# Patient Record
Sex: Female | Born: 1955 | Hispanic: No | Marital: Married | Smoking: Never smoker
Health system: Southern US, Community
[De-identification: ages and names within clinical notes are randomized; demographics above are authoritative.]

## PROBLEM LIST (undated history)

## (undated) DIAGNOSIS — F32A Depression, unspecified: Secondary | ICD-10-CM

## (undated) DIAGNOSIS — F419 Anxiety disorder, unspecified: Secondary | ICD-10-CM

## (undated) DIAGNOSIS — F329 Major depressive disorder, single episode, unspecified: Secondary | ICD-10-CM

## (undated) DIAGNOSIS — L409 Psoriasis, unspecified: Secondary | ICD-10-CM

---
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
## 1998-02-24 ENCOUNTER — Encounter: Admission: RE | Admit: 1998-02-24 | Discharge: 1998-02-24 | Payer: Self-pay | Admitting: Sports Medicine

## 1998-04-10 ENCOUNTER — Encounter: Admission: RE | Admit: 1998-04-10 | Discharge: 1998-04-10 | Payer: Self-pay | Admitting: Family Medicine

## 1998-08-14 ENCOUNTER — Encounter: Admission: RE | Admit: 1998-08-14 | Discharge: 1998-08-14 | Payer: Self-pay | Admitting: Sports Medicine

## 1998-10-30 ENCOUNTER — Encounter: Admission: RE | Admit: 1998-10-30 | Discharge: 1998-10-30 | Payer: Self-pay | Admitting: Sports Medicine

## 1999-02-19 ENCOUNTER — Other Ambulatory Visit: Admission: RE | Admit: 1999-02-19 | Discharge: 1999-02-19 | Payer: Self-pay | Admitting: Obstetrics and Gynecology

## 1999-03-06 ENCOUNTER — Ambulatory Visit (HOSPITAL_COMMUNITY): Admission: RE | Admit: 1999-03-06 | Discharge: 1999-03-06 | Payer: Self-pay | Admitting: Obstetrics and Gynecology

## 1999-03-06 ENCOUNTER — Encounter: Payer: Self-pay | Admitting: Obstetrics and Gynecology

## 2000-04-28 ENCOUNTER — Encounter: Admission: RE | Admit: 2000-04-28 | Discharge: 2000-04-28 | Payer: Self-pay | Admitting: Family Medicine

## 2000-05-05 ENCOUNTER — Other Ambulatory Visit: Admission: RE | Admit: 2000-05-05 | Discharge: 2000-05-05 | Payer: Self-pay | Admitting: *Deleted

## 2000-05-19 ENCOUNTER — Encounter: Payer: Self-pay | Admitting: *Deleted

## 2000-05-19 ENCOUNTER — Ambulatory Visit (HOSPITAL_COMMUNITY): Admission: RE | Admit: 2000-05-19 | Discharge: 2000-05-19 | Payer: Self-pay | Admitting: *Deleted

## 2001-05-19 ENCOUNTER — Encounter: Admission: RE | Admit: 2001-05-19 | Discharge: 2001-05-19 | Payer: Self-pay | Admitting: Sports Medicine

## 2001-05-20 ENCOUNTER — Ambulatory Visit (HOSPITAL_COMMUNITY): Admission: RE | Admit: 2001-05-20 | Discharge: 2001-05-20 | Payer: Self-pay | Admitting: Obstetrics and Gynecology

## 2001-05-20 ENCOUNTER — Encounter: Payer: Self-pay | Admitting: Obstetrics and Gynecology

## 2001-07-21 ENCOUNTER — Other Ambulatory Visit: Admission: RE | Admit: 2001-07-21 | Discharge: 2001-07-21 | Payer: Self-pay | Admitting: Obstetrics and Gynecology

## 2002-08-10 ENCOUNTER — Other Ambulatory Visit: Admission: RE | Admit: 2002-08-10 | Discharge: 2002-08-10 | Payer: Self-pay | Admitting: *Deleted

## 2002-08-31 ENCOUNTER — Encounter: Payer: Self-pay | Admitting: Obstetrics and Gynecology

## 2002-08-31 ENCOUNTER — Ambulatory Visit (HOSPITAL_COMMUNITY): Admission: RE | Admit: 2002-08-31 | Discharge: 2002-08-31 | Payer: Self-pay | Admitting: Obstetrics and Gynecology

## 2003-03-30 ENCOUNTER — Other Ambulatory Visit: Admission: RE | Admit: 2003-03-30 | Discharge: 2003-03-30 | Payer: Self-pay | Admitting: Obstetrics and Gynecology

## 2003-09-08 ENCOUNTER — Ambulatory Visit (HOSPITAL_COMMUNITY): Admission: RE | Admit: 2003-09-08 | Discharge: 2003-09-08 | Payer: Self-pay | Admitting: Obstetrics and Gynecology

## 2004-05-02 ENCOUNTER — Other Ambulatory Visit: Admission: RE | Admit: 2004-05-02 | Discharge: 2004-05-02 | Payer: Self-pay | Admitting: Obstetrics and Gynecology

## 2007-02-04 ENCOUNTER — Ambulatory Visit (HOSPITAL_COMMUNITY): Admission: RE | Admit: 2007-02-04 | Discharge: 2007-02-04 | Payer: Self-pay | Admitting: Obstetrics and Gynecology

## 2008-03-08 ENCOUNTER — Ambulatory Visit (HOSPITAL_COMMUNITY): Admission: RE | Admit: 2008-03-08 | Discharge: 2008-03-08 | Payer: Self-pay | Admitting: Obstetrics and Gynecology

## 2008-04-26 ENCOUNTER — Ambulatory Visit: Payer: Self-pay | Admitting: Sports Medicine

## 2008-04-26 DIAGNOSIS — Q667 Congenital pes cavus, unspecified foot: Secondary | ICD-10-CM

## 2008-04-26 DIAGNOSIS — M79609 Pain in unspecified limb: Secondary | ICD-10-CM

## 2008-04-26 DIAGNOSIS — E669 Obesity, unspecified: Secondary | ICD-10-CM | POA: Insufficient documentation

## 2008-04-26 DIAGNOSIS — L408 Other psoriasis: Secondary | ICD-10-CM | POA: Insufficient documentation

## 2010-03-22 ENCOUNTER — Ambulatory Visit (HOSPITAL_COMMUNITY): Admission: RE | Admit: 2010-03-22 | Discharge: 2010-03-22 | Payer: Self-pay | Admitting: Obstetrics and Gynecology

## 2010-07-29 ENCOUNTER — Encounter: Payer: Self-pay | Admitting: Obstetrics and Gynecology

## 2011-02-20 ENCOUNTER — Other Ambulatory Visit (HOSPITAL_COMMUNITY): Payer: Self-pay | Admitting: Obstetrics and Gynecology

## 2011-02-20 DIAGNOSIS — Z1231 Encounter for screening mammogram for malignant neoplasm of breast: Secondary | ICD-10-CM

## 2011-03-26 ENCOUNTER — Ambulatory Visit (HOSPITAL_COMMUNITY): Payer: Self-pay

## 2011-03-26 DIAGNOSIS — J309 Allergic rhinitis, unspecified: Secondary | ICD-10-CM | POA: Insufficient documentation

## 2011-03-28 ENCOUNTER — Ambulatory Visit (HOSPITAL_COMMUNITY): Payer: Self-pay

## 2011-04-16 ENCOUNTER — Ambulatory Visit (HOSPITAL_COMMUNITY): Payer: Self-pay

## 2011-05-07 ENCOUNTER — Ambulatory Visit (HOSPITAL_COMMUNITY): Payer: Self-pay

## 2011-05-22 ENCOUNTER — Ambulatory Visit (HOSPITAL_COMMUNITY): Payer: Self-pay

## 2011-10-17 ENCOUNTER — Telehealth: Payer: Self-pay | Admitting: Obstetrics and Gynecology

## 2011-10-17 NOTE — Telephone Encounter (Signed)
TC TO PT REGARDING MESSAGE. PT WOULD LIKE A DISCOUNT CARD FOR SILENOR 3MG  . TOLD PT THAT I WILL LEAVE CARD AT THE  FRONT DESK  FOR PT TO PICK UP AND IF WE HAVE A SAMPLE WILL LEAVE THAT AS WELL. PT VOICED UNDERSTANDING.

## 2012-01-20 ENCOUNTER — Other Ambulatory Visit: Payer: Self-pay | Admitting: Obstetrics and Gynecology

## 2012-05-20 ENCOUNTER — Other Ambulatory Visit: Payer: Self-pay | Admitting: Obstetrics and Gynecology

## 2012-05-21 ENCOUNTER — Other Ambulatory Visit: Payer: Self-pay | Admitting: Obstetrics and Gynecology

## 2012-05-21 MED ORDER — DOXEPIN HCL 6 MG PO TABS: 1.0000 | ORAL_TABLET | Freq: Every evening | ORAL | Status: DC | PRN

## 2012-05-21 NOTE — Telephone Encounter (Signed)
EP to address

## 2013-07-08 HISTORY — PX: FRACTURE SURGERY: SHX138

## 2014-04-01 DIAGNOSIS — S82853A Displaced trimalleolar fracture of unspecified lower leg, initial encounter for closed fracture: Secondary | ICD-10-CM | POA: Insufficient documentation

## 2015-03-01 ENCOUNTER — Encounter: Payer: Self-pay | Admitting: Sports Medicine

## 2015-04-04 DIAGNOSIS — L97529 Non-pressure chronic ulcer of other part of left foot with unspecified severity: Secondary | ICD-10-CM

## 2015-04-04 DIAGNOSIS — L97519 Non-pressure chronic ulcer of other part of right foot with unspecified severity: Secondary | ICD-10-CM | POA: Insufficient documentation

## 2016-12-17 ENCOUNTER — Encounter: Payer: Self-pay | Admitting: Family Medicine

## 2016-12-17 ENCOUNTER — Ambulatory Visit (INDEPENDENT_AMBULATORY_CARE_PROVIDER_SITE_OTHER): Payer: BLUE CROSS/BLUE SHIELD | Admitting: Family Medicine

## 2016-12-17 DIAGNOSIS — M79671 Pain in right foot: Secondary | ICD-10-CM

## 2016-12-17 DIAGNOSIS — M79672 Pain in left foot: Secondary | ICD-10-CM

## 2016-12-18 NOTE — Progress Notes (Signed)
PCP: Deloris Pingyter-Brown, Sherry M, MD  Subjective:   HPI: Patient is a 61 y.o. female here for orthotics.  Patient was last seen in [place] office about 8-9 years ago for custom orthotics. Has a couple pair but both broken down, cracked in plastic portion posteriorly. She is planning to do more racing, activities with her daughter-in-law. Orthotics have made a difference in past with IT band syndrome, pain in arches and knees. 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 Doing some stretches at home. Pain level 0/10 currently though.  No past medical history on file.  Current Outpatient Prescriptions on File Prior to Visit  Medication Sig Dispense Refill  . Doxepin HCl (SILENOR) 6 MG TABS Take 1 tablet (6 mg total) by mouth at bedtime as needed. 30 tablet 5   No current facility-administered medications on file prior to visit.     No past surgical history on file.  Allergies not on file  Social History   Social History  . Marital status: Married    Spouse name: N/A  . Number of children: N/A  . Years of education: N/A   Occupational History  . Not on file.   Social History Main Topics  . Smoking status: Never Smoker  . Smokeless tobacco: Never Used  . Alcohol use Not on file  . Drug use: Unknown  . Sexual activity: Not on file   Other Topics Concern  . Not on file   Social History Narrative  . No narrative on file    No family history on file.  BP 134/81   Pulse 76   Ht 5\' 5"  (1.651 m)   Wt 200 lb (90.7 kg)   BMI 33.28 kg/m   Review of Systems: See HPI above.     Objective:  Physical Exam:  Gen: NAD, comfortable in exam room  Bilateral feet/ankles: No leg length discrepancy. Psoriatic plaques noted lower legs, feet with dryness and scaling. Cavus arches. FROM ankles, 1st MTP joints. No hallux rigidus or valgus. No callus over MT heads or transverse arch collapse.   Assessment & Plan:  1. Bilateral foot and leg pain - done well in past with custom  orthotics and current ones are broken down.  Two new pairs made today.  Felt comfortable - advised to call if any problems.  Given hip abduction exercise and stretches for ITBS.  Patient was fitted for a : standard, cushioned, semi-rigid orthotic. The orthotic was heated and afterward the patient stood on the orthotic blank positioned on the orthotic stand. The patient was positioned in subtalar neutral position and 10 degrees of ankle dorsiflexion in a weight bearing stance. After completion of molding, a stable base was applied to the orthotic blank. The blank was ground to a stable position for weight bearing. Size: 9 blue swirl Base: blue med density eva Posting: none Additional orthotic padding: none Total prep time 50 minutes

## 2016-12-18 NOTE — Assessment & Plan Note (Signed)
done well in past with custom orthotics and current ones are broken down.  Two new pairs made today.  Felt comfortable - advised to call if any problems.  Given hip abduction exercise and stretches for ITBS.  Patient was fitted for a : standard, cushioned, semi-rigid orthotic. The orthotic was heated and afterward the patient stood on the orthotic blank positioned on the orthotic stand. The patient was positioned in subtalar neutral position and 10 degrees of ankle dorsiflexion in a weight bearing stance. After completion of molding, a stable base was applied to the orthotic blank. The blank was ground to a stable position for weight bearing. Size: 9 blue swirl Base: blue med density eva Posting: none Additional orthotic padding: none Total prep time 50 minutes

## 2019-04-14 ENCOUNTER — Other Ambulatory Visit: Payer: Self-pay

## 2019-04-14 ENCOUNTER — Ambulatory Visit (INDEPENDENT_AMBULATORY_CARE_PROVIDER_SITE_OTHER): Payer: BC Managed Care – PPO | Admitting: Orthopaedic Surgery

## 2019-04-14 DIAGNOSIS — M25552 Pain in left hip: Secondary | ICD-10-CM

## 2019-04-14 DIAGNOSIS — M1612 Unilateral primary osteoarthritis, left hip: Secondary | ICD-10-CM

## 2019-04-14 MED ORDER — CELECOXIB 200 MG PO CAPS: 200.0000 mg | ORAL_CAPSULE | Freq: Two times a day (BID) | ORAL | 1 refills | Status: DC | PRN

## 2019-04-14 NOTE — Progress Notes (Signed)
Office Visit Note   Patient: Stacey Patrick           Date of Birth: August 23, 1955           MRN: [id number] Visit Date: 04/14/2019              Requested by: Wayland Salinas, MD [address] [place],  [place] [address] PCP: Wayland Salinas, MD   Assessment & Plan: Visit Diagnoses:  1. Pain in left hip   2. Unilateral primary osteoarthritis, left hip     Plan: I did talk to her about her left hip arthritis.  We do feel is appropriate to obtain an MRI of her left hip to better assess the cartilage and to get a better idea of the extent of her arthritis although on plain film it does look significant to me.  She agrees with this treatment plan.  She is can continue strengthening her hip and work on trying activity modification.  I will send in some Celebrex to see if that helps her differently.  We will see her back after the MRI of her left hip.  Again I feel this is medically necessary at this point given the failure of all forms of conservative treatment.  All question concerns were answered and addressed.  Follow-Up Instructions: Return in about 2 weeks (around 04/28/2019).   Orders:  No orders of the defined types were placed in this encounter.  No orders of the defined types were placed in this encounter.     Procedures: No procedures performed   Clinical Data: No additional findings.   Subjective: Chief Complaint  Patient presents with  . Left Hip - Pain  The patient is a very pleasant and active 63 year old female who is been having severe left hip pain is been worsening for 8 to 9 months now.  She has been to physical therapy for her left hip.  She has had an intra-articular steroid injection in her left hip as well as a piriformis injection.  It does hurt in the groin.  It is detrimentally affecting her actives daily living, her quality of life, and her mobility.  She started to walk different as a result of her left hip pain.  She does have  x-rays that accompany her today.  Things do seem to be worsening for her.  She is having problems putting her socks and shoes on on the left side.  She has no issues with the right side.  She denies any weakness in that leg and denies any numbness and tingling going down her foot.  She is never injured herself before as she is aware of.  There is not a family history of arthritis that she is aware of.  Again she is tried and failed all forms conservative treatment for over 6 months now and that includes anti-inflammatories, physical therapy, multiple injections, rest and activity modification.  HPI  Review of Systems She currently denies any headache, chest pain, shortness of breath, fever, chills, nausea, vomiting  Objective: Vital Signs: There were no vitals taken for this visit.  Physical Exam She is alert and orient x3 and in no acute distress Ortho Exam Examination of her left hip shows pain in the groin with stiffness on internal and external rotation.  Her right hip moves smoothly and normally.  Her bilateral knee exam is normal. Specialty Comments:  No specialty comments available.  Imaging: No results found. X-rays that accompany her are reviewed with  her as well and shows significant arthritis of the left hip.  There is joint space narrowing and flattening of the anterior lateral femoral head.  There peritalar osteophytes.  The right hip appears normal but the left hip definitely has significant narrowing.  PMFS History: Patient Active Problem List   Diagnosis Date Noted  . Unilateral primary osteoarthritis, left hip 04/14/2019  . Skin ulcers of foot, bilateral ([facility]) 04/04/2015  . Trimalleolar fracture 04/01/2014  . Allergic rhinitis 03/26/2011  . OBESITY, UNSPECIFIED 04/26/2008  . PSORIASIS 04/26/2008  . FOOT PAIN, BILATERAL 04/26/2008  . TALIPES CAVUS 04/26/2008   No past medical history on file.  No family history on file.   Social History   Occupational History  .  Not on file  Tobacco Use  . Smoking status: Never Smoker  . Smokeless tobacco: Never Used  Substance and Sexual Activity  . Alcohol use: Not on file  . Drug use: Not on file  . Sexual activity: Not on file

## 2019-04-29 ENCOUNTER — Telehealth: Payer: Self-pay | Admitting: Orthopaedic Surgery

## 2019-04-29 NOTE — Telephone Encounter (Signed)
Please advise 

## 2019-04-29 NOTE — Telephone Encounter (Signed)
The MRI will show everything from the front around to the back and her gluteal area.

## 2019-04-29 NOTE — Telephone Encounter (Signed)
Patient called. Would like to know if her MRI will be on her buttocks as well as hip. Her call back number is [phone]

## 2019-04-30 ENCOUNTER — Ambulatory Visit (HOSPITAL_COMMUNITY)
Admission: RE | Admit: 2019-04-30 | Discharge: 2019-04-30 | Disposition: A | Discharge status: Home / Self Care | Payer: BC Managed Care – PPO | Source: Ambulatory Visit | Attending: Orthopaedic Surgery | Admitting: Orthopaedic Surgery

## 2019-04-30 ENCOUNTER — Other Ambulatory Visit: Payer: Self-pay

## 2019-04-30 DIAGNOSIS — M25552 Pain in left hip: Secondary | ICD-10-CM | POA: Insufficient documentation

## 2019-05-05 ENCOUNTER — Ambulatory Visit: Payer: BC Managed Care – PPO | Admitting: Orthopaedic Surgery

## 2019-05-05 NOTE — Telephone Encounter (Signed)
Patient aware and her appt was reschedule to next week

## 2019-05-13 ENCOUNTER — Ambulatory Visit: Payer: BC Managed Care – PPO | Admitting: Orthopaedic Surgery

## 2019-05-19 ENCOUNTER — Encounter: Payer: Self-pay | Admitting: Physician Assistant

## 2019-05-19 ENCOUNTER — Ambulatory Visit (INDEPENDENT_AMBULATORY_CARE_PROVIDER_SITE_OTHER): Payer: BC Managed Care – PPO | Admitting: Physician Assistant

## 2019-05-19 ENCOUNTER — Other Ambulatory Visit: Payer: Self-pay

## 2019-05-19 DIAGNOSIS — M1612 Unilateral primary osteoarthritis, left hip: Secondary | ICD-10-CM

## 2019-05-19 NOTE — Progress Notes (Signed)
Office Visit Note   Patient: Stacey Patrick           Date of Birth: April 12, 1956           MRN: [id number] Visit Date: 05/19/2019              Requested by: Wayland Salinas, MD [address] [place],  [place] [address] PCP: Wayland Salinas, MD   Assessment & Plan: Visit Diagnoses:  1. Osteoarthritis of left hip, unspecified osteoarthritis type     Plan: Due to the fact the patient has severe pain in his right quality-of-life despite conservative treatment which is including limiting activities, NSAIDs and intra-articular injection.  Along with the  MRI showing severe arthritis along with AVN versus an insufficiency fracture of the femoral head with slight femoral head collapse recommend left total hip arthroplasty.  Risk benefits of surgery discussed with patient.  Postoperative protocol discussed with patient.  Risk include but are not limited to nerve vessel injury, wound healing problems, blood loss, infection and DVT/PE,, reviewed with the patient.  Questions encouraged and answered at length by Dr. Ninfa Linden and myself.  Handout on anterior hip surgery was given to the patient.  She will follow-up with [place] 2 weeks postop.   Follow-Up Instructions: Return 2 weeks post op.   Orders:  No orders of the defined types were placed in this encounter.  No orders of the defined types were placed in this encounter.     Procedures: No procedures performed   Clinical Data: No additional findings.   Subjective: Chief Complaint  Patient presents with  . Left Hip - Follow-up    HPI Mrs. Zavadil returns today to go over the MRI of the left hip.  She continues to have severe pain in the left hip which limits her daily activities.  She has difficulty even walking her dog due to the pain in the hip.  She states it limits her physical activity such as hiking, walking and biking for exercise. MRI dated 05/01/2019 left hip is reviewed with the patient.  Actual  images shown to the patient.  Images show severe osteoarthritis left hip.  Also severe marrow edema is seen within the femoral head superiorly along with mild articular surface collapse concerning for AVN versus subchondral fracture. Review of Systems Negative for fevers chills shortness of breath chest pain  Objective: Vital Signs: There were no vitals taken for this visit.  Physical Exam Constitutional:      Appearance: She is not ill-appearing or diaphoretic.  Pulmonary:     Effort: Pulmonary effort is normal.  Neurological:     Mental Status: She is alert and oriented to person, place, and time.  Psychiatric:        Mood and Affect: Mood normal.        Behavior: Behavior normal.     Ortho Exam  Specialty Comments:  No specialty comments available.  Imaging: No results found.   PMFS History: Patient Active Problem List   Diagnosis Date Noted  . Unilateral primary osteoarthritis, left hip 04/14/2019  . Skin ulcers of foot, bilateral ([place]) 04/04/2015  . Trimalleolar fracture 04/01/2014  . Allergic rhinitis 03/26/2011  . OBESITY, UNSPECIFIED 04/26/2008  . PSORIASIS 04/26/2008  . FOOT PAIN, BILATERAL 04/26/2008  . TALIPES CAVUS 04/26/2008   History reviewed. No pertinent past medical history.  History reviewed. No pertinent family history.  History reviewed. No pertinent surgical history. Social History   Occupational History  . Not  on file  Tobacco Use  . Smoking status: Never Smoker  . Smokeless tobacco: Never Used  Substance and Sexual Activity  . Alcohol use: Not on file  . Drug use: Not on file  . Sexual activity: Not on file

## 2019-06-08 ENCOUNTER — Telehealth: Payer: Self-pay

## 2019-06-08 ENCOUNTER — Other Ambulatory Visit: Payer: Self-pay | Admitting: Orthopaedic Surgery

## 2019-06-08 MED ORDER — CELECOXIB 200 MG PO CAPS: 200.0000 mg | ORAL_CAPSULE | Freq: Two times a day (BID) | ORAL | 1 refills | Status: DC | PRN

## 2019-06-08 NOTE — Telephone Encounter (Signed)
I'll send some in. 

## 2019-06-08 NOTE — Telephone Encounter (Signed)
Refill request for Celebrex Ok to refill? [facility]; [address] [employer]

## 2019-06-17 ENCOUNTER — Other Ambulatory Visit: Payer: Self-pay | Admitting: Physician Assistant

## 2019-06-21 NOTE — Patient Instructions (Addendum)
DUE TO COVID-19 ONLY ONE VISITOR IS ALLOWED TO COME WITH YOU AND STAY IN THE WAITING ROOM ONLY DURING PRE OP AND PROCEDURE DAY OF SURGERY. THE 1 VISITOR MAY VISIT WITH YOU AFTER SURGERY IN YOUR PRIVATE ROOM DURING VISITING HOURS ONLY!  YOU NEED TO HAVE A COVID 19 TEST ON_12/15______ @_12 :25______, THIS TEST MUST BE DONE BEFORE SURGERY, COME  [place] , [id number].  ([place]) ONCE YOUR COVID TEST IS COMPLETED, PLEASE BEGIN THE QUARANTINE INSTRUCTIONS AS OUTLINED IN YOUR HANDOUT.                Stacey Patrick    Your procedure is scheduled on: 06/25/19   Report to [facility] Main  Entrance   Report to Short Stay 5:30 AM     Call this number if you have problems the morning of surgery [place], NO CHEWING GUM CANDY OR MINTS.  Do not eat food After Midnight.   YOU MAY HAVE CLEAR LIQUIDS FROM MIDNIGHT UNTIL 4:30AM  . At 4:30AM Please finish the prescribed Pre-Surgery drink.   Nothing by mouth after you finish the drink !    Take these medicines the morning of surgery with A SIP OF WATER: Cymbalta                                 You may not have any metal on your body including hair pins and              piercings  Do not wear jewelry, make-up, lotions, powders or perfumes, deodorant             Do not wear nail polish on your fingernails.  Do not shave  48 hours prior to surgery.            Do not bring valuables to the hospital. [place].  Contacts, dentures or bridgework may not be worn into surgery.        Special Instructions: N/A              Please read over the following fact sheets you were given: _____________________________________________________________________             [facility] - Preparing for Surgery  Before surgery, you can play an important role.   Because skin is not sterile, your  skin needs to be as free of germs as possible.   You can reduce the number of germs on your skin by washing with CHG (chlorahexidine gluconate) soap before surgery.   CHG is an antiseptic cleaner which kills germs and bonds with the skin to continue killing germs even after washing. Please DO NOT use if you have an allergy to CHG or antibacterial soaps .  If your skin becomes reddened/irritated stop using the CHG and inform your nurse when you arrive at Short Stay. .  You may shave your face/neck . Please follow these instructions carefully:  1.  Shower with CHG Soap the night before surgery and the  morning of Surgery.  2.  If you choose to wash your hair, wash your hair first as usual with your  normal  shampoo.  3.  After you shampoo, rinse your hair and body thoroughly  to remove the  shampoo.                                        4.  Use CHG as you would any other liquid soap.  You can apply chg directly  to the skin and wash                       Gently with a scrungie or clean washcloth.  5.  Apply the CHG Soap to your body ONLY FROM THE NECK DOWN.   Do not use on face/ open                           Wound or open sores. Avoid contact with eyes, ears mouth and genitals (private parts).                       Wash face,  Genitals (private parts) with your normal soap.             6.  Wash thoroughly, paying special attention to the area where your surgery  will be performed.  7.  Thoroughly rinse your body with warm water from the neck down.  8.  DO NOT shower/wash with your normal soap after using and rinsing off  the CHG Soap.             9.  Pat yourself dry with a clean towel.            10.  Wear clean pajamas.            11.  Place clean sheets on your bed the night of your first shower and do not  sleep with pets. Day of Surgery : Do not apply any lotions/deodorants the morning of surgery.  Please wear clean clothes to the hospital/surgery center.  FAILURE TO FOLLOW THESE  INSTRUCTIONS MAY RESULT IN THE CANCELLATION OF YOUR SURGERY PATIENT SIGNATURE_________________________________  NURSE SIGNATURE__________________________________  ________________________________________________________________________   Stacey Patrick  An incentive spirometer is a tool that can help keep your lungs clear and active. This tool measures how well you are filling your lungs with each breath. Taking long deep breaths may help reverse or decrease the chance of developing breathing (pulmonary) problems (especially infection) following:  A long period of time when you are unable to move or be active. BEFORE THE PROCEDURE   If the spirometer includes an indicator to show your best effort, your nurse or respiratory therapist will set it to a desired goal.  If possible, sit up straight or lean slightly forward. Try not to slouch.  Hold the incentive spirometer in an upright position. INSTRUCTIONS FOR USE  1. Sit on the edge of your bed if possible, or sit up as far as you can in bed or on a chair. 2. Hold the incentive spirometer in an upright position. 3. Breathe out normally. 4. Place the mouthpiece in your mouth and seal your lips tightly around it. 5. Breathe in slowly and as deeply as possible, raising the piston or the ball toward the top of the column. 6. Hold your breath for 3-5 seconds or for as long as possible. Allow the piston or ball to fall to the bottom of the column. 7. Remove the mouthpiece from your mouth and breathe out normally. 8. Rest for  a few seconds and repeat Steps 1 through 7 at least 10 times every 1-2 hours when you are awake. Take your time and take a few normal breaths between deep breaths. 9. The spirometer may include an indicator to show your best effort. Use the indicator as a goal to work toward during each repetition. 10. After each set of 10 deep breaths, practice coughing to be sure your lungs are clear. If you have an incision (the  cut made at the time of surgery), support your incision when coughing by placing a pillow or rolled up towels firmly against it. Once you are able to get out of bed, walk around indoors and cough well. You may stop using the incentive spirometer when instructed by your caregiver.  RISKS AND COMPLICATIONS  Take your time so you do not get dizzy or light-headed.  If you are in pain, you may need to take or ask for pain medication before doing incentive spirometry. It is harder to take a deep breath if you are having pain. AFTER USE  Rest and breathe slowly and easily.  It can be helpful to keep track of a log of your progress. Your caregiver can provide you with a simple table to help with this. If you are using the spirometer at home, follow these instructions: [place] IF:   You are having difficultly using the spirometer.  You have trouble using the spirometer as often as instructed.  Your pain medication is not giving enough relief while using the spirometer.  You develop fever of 100.5 F (38.1 C) or higher. SEEK IMMEDIATE MEDICAL CARE IF:   You cough up bloody sputum that had not been present before.  You develop fever of 102 F (38.9 C) or greater.  You develop worsening pain at or near the incision site. MAKE SURE YOU:   Understand these instructions.  Will watch your condition.  Will get help right away if you are not doing well or get worse. Document Released: 11/04/2006 Document Revised: 09/16/2011 Document Reviewed: 01/05/2007 [facility] Patient Information 2014 [place], [place].   ________________________________________________________________________

## 2019-06-22 ENCOUNTER — Other Ambulatory Visit (HOSPITAL_COMMUNITY)
Admission: RE | Admit: 2019-06-22 | Discharge: 2019-06-22 | Disposition: A | Discharge status: Home / Self Care | Payer: BC Managed Care – PPO | Source: Ambulatory Visit | Attending: Orthopaedic Surgery | Admitting: Orthopaedic Surgery

## 2019-06-22 ENCOUNTER — Other Ambulatory Visit: Payer: Self-pay

## 2019-06-22 ENCOUNTER — Encounter (HOSPITAL_COMMUNITY): Payer: Self-pay

## 2019-06-22 ENCOUNTER — Encounter (HOSPITAL_COMMUNITY)
Admission: RE | Admit: 2019-06-22 | Discharge: 2019-06-22 | Disposition: A | Discharge status: Home / Self Care | Payer: BC Managed Care – PPO | Source: Ambulatory Visit | Attending: Orthopaedic Surgery | Admitting: Orthopaedic Surgery

## 2019-06-22 DIAGNOSIS — Z20828 Contact with and (suspected) exposure to other viral communicable diseases: Secondary | ICD-10-CM | POA: Diagnosis not present

## 2019-06-22 DIAGNOSIS — M1612 Unilateral primary osteoarthritis, left hip: Secondary | ICD-10-CM | POA: Diagnosis not present

## 2019-06-22 DIAGNOSIS — Z01812 Encounter for preprocedural laboratory examination: Secondary | ICD-10-CM | POA: Insufficient documentation

## 2019-06-22 DIAGNOSIS — M87052 Idiopathic aseptic necrosis of left femur: Secondary | ICD-10-CM | POA: Diagnosis not present

## 2019-06-22 HISTORY — DX: Depression, unspecified: F32.A

## 2019-06-22 HISTORY — DX: Anxiety disorder, unspecified: F41.9

## 2019-06-22 HISTORY — DX: Psoriasis, unspecified: L40.9

## 2019-06-22 LAB — CBC
HCT: 44.3 % (ref 36.0–46.0)
Hemoglobin: 14.2 g/dL (ref 12.0–15.0)
MCH: 30.1 pg (ref 26.0–34.0)
MCHC: 32.1 g/dL (ref 30.0–36.0)
MCV: 93.9 fL (ref 80.0–100.0)
Platelets: 267 10*3/uL (ref 150–400)
RBC: 4.72 MIL/uL (ref 3.87–5.11)
RDW: 12.3 % (ref 11.5–15.5)
WBC: 4.2 10*3/uL (ref 4.0–10.5)
nRBC: 0 % (ref 0.0–0.2)

## 2019-06-22 LAB — BASIC METABOLIC PANEL
Anion gap: 8 (ref 5–15)
BUN: 25 mg/dL — ABNORMAL HIGH (ref 8–23)
CO2: 27 mmol/L (ref 22–32)
Calcium: 9.6 mg/dL (ref 8.9–10.3)
Chloride: 105 mmol/L (ref 98–111)
Creatinine, Ser: 0.61 mg/dL (ref 0.44–1.00)
GFR calc Af Amer: >60 mL/min (ref >60)
GFR calc non Af Amer: >60 mL/min (ref >60)
Glucose, Bld: 96 mg/dL (ref 70–99)
Potassium: 5 mmol/L (ref 3.5–5.1)
Sodium: 140 mmol/L (ref 135–145)

## 2019-06-22 LAB — SURGICAL PCR SCREEN
MRSA, PCR: NEGATIVE
Staphylococcus aureus: NEGATIVE

## 2019-06-22 NOTE — Progress Notes (Addendum)
PCP - Dr. Leonides Cave Cardiologist - none  Chest x-ray - no EKG - no Stress Test - no ECHO - no Cardiac Cath - no  Sleep Study - yes. mild CPAP - no  Fasting Blood Sugar - NA Checks Blood Sugar _____ times a day  Blood Thinner Instructions:NA Aspirin Instructions: Last Dose:  Anesthesia review:   Patient denies shortness of breath, fever, cough and chest pain at PAT appointment yes  Patient verbalized understanding of instructions that were given to them at the PAT appointment. Patient was also instructed that they will need to review over the PAT instructions again at home before surgery. yes

## 2019-06-23 LAB — NOVEL CORONAVIRUS, NAA (HOSP ORDER, SEND-OUT TO REF LAB; TAT 18-24 HRS): SARS-CoV-2, NAA: NOT DETECTED

## 2019-06-24 NOTE — H&P (Signed)
TOTAL HIP ADMISSION H&P  Patient is admitted for left total hip arthroplasty.  Subjective:  Chief Complaint: left hip pain  HPI: Stacey Patrick, 62 y.o. female, has a history of pain and functional disability in the left hip(s) due to arthritis and patient has failed non-surgical conservative treatments for greater than 12 weeks to include NSAID's and/or analgesics, flexibility and strengthening excercises and activity modification.  Onset of symptoms was abrupt starting 1 years ago with rapidlly worsening course since that time.The patient noted no past surgery on the left hip(s).  Patient currently rates pain in the left hip at 10 out of 10 with activity. Patient has night pain, worsening of pain with activity and weight bearing, pain that interfers with activities of daily living and pain with passive range of motion. Patient has evidence of subchondral cysts, subchondral sclerosis, periarticular osteophytes and joint space narrowing by imaging studies. This condition presents safety issues increasing the risk of falls.  There is no current active infection.  Patient Active Problem List   Diagnosis Date Noted  . Unilateral primary osteoarthritis, left hip 04/14/2019  . Skin ulcers of foot, bilateral ([place]) 04/04/2015  . Trimalleolar fracture 04/01/2014  . Allergic rhinitis 03/26/2011  . OBESITY, UNSPECIFIED 04/26/2008  . PSORIASIS 04/26/2008  . FOOT PAIN, BILATERAL 04/26/2008  . TALIPES CAVUS 04/26/2008   Past Medical History:  Diagnosis Date  . Anxiety   . Depression   . Psoriasis    palmar and plantar    Past Surgical History:  Procedure Laterality Date  . FRACTURE SURGERY Left 2015   ankle    No current facility-administered medications for this encounter.   Current Outpatient Medications  Medication Sig Dispense Refill Last Dose  . acitretin (SORIATANE) 25 MG capsule Take 25 mg by mouth See admin instructions. 1-2 times a week  5   . amoxicillin-clavulanate (AUGMENTIN)  875-125 MG tablet Take 1 tablet by mouth 2 (two) times daily.     . calcipotriene-betamethasone (TACLONEX) ointment Apply 1 application topically daily as needed (psoriasis).   4   . calcium carbonate (OSCAL) 1500 (600 Ca) MG TABS tablet Take 600 mg of elemental calcium by mouth See admin instructions. Three times a week     . celecoxib (CELEBREX) 200 MG capsule Take 1 capsule (200 mg total) by mouth 2 (two) times daily between meals as needed. (Patient taking differently: Take 200 mg by mouth 2 (two) times daily. ) 60 capsule 1   . cholecalciferol (VITAMIN D) 25 MCG (1000 UT) tablet Take 1,000 Units by mouth daily.     . clobetasol ointment (TEMOVATE) 6.96 % Apply 1 application topically daily as needed (psoriasis).   0   . DULoxetine (CYMBALTA) 20 MG capsule Take 20 mg by mouth daily.   0   . KERALYT 6 % gel Apply 1 application topically daily.     Marland Kitchen NALTREXONE HCL PO Take 6 mg by mouth daily. Compounded     . traZODone (DESYREL) 50 MG tablet Take 25 mg by mouth at bedtime.     . Doxepin HCl (SILENOR) 6 MG TABS Take 1 tablet (6 mg total) by mouth at bedtime as needed. (Patient not taking: Reported on 06/15/2019) 30 tablet 5 Not Taking at Unknown time   Allergies  Allergen Reactions  . Benzene Rash    Ingredient in Sunscreen     Social History   Tobacco Use  . Smoking status: Never Smoker  . Smokeless tobacco: Never Used  Substance Use Topics  .  Alcohol use: Never    No family history on file.   Review of Systems  All other systems reviewed and are negative.   Objective:  Physical Exam  Constitutional: She is oriented to person, place, and time. She appears well-developed and well-nourished.  HENT:  Head: Normocephalic and atraumatic.  Eyes: Pupils are equal, round, and reactive to light. EOM are normal.  Cardiovascular: Normal rate and regular rhythm.  Respiratory: Effort normal and breath sounds normal.  GI: Soft. Bowel sounds are normal.  Musculoskeletal:     Cervical  back: Normal range of motion and neck supple.     Left hip: Tenderness and bony tenderness present. Decreased range of motion. Decreased strength.  Neurological: She is alert and oriented to person, place, and time.  Skin: Skin is warm and dry.  Psychiatric: She has a normal mood and affect.    Vital signs in last 24 hours:    Labs:   Estimated body mass index is 33.66 kg/m as calculated from the following:   Height as of 06/22/19: 5\' 5"  (1.651 m).   Weight as of 06/22/19: 91.8 kg.   Imaging Review Plain radiographs demonstrate severe degenerative joint disease of the left hip(s). The bone quality appears to be excellent for age and reported activity level.      Assessment/Plan:  End stage arthritis, left hip(s)  The patient history, physical examination, clinical judgement of the provider and imaging studies are consistent with end stage degenerative joint disease of the left hip(s) and total hip arthroplasty is deemed medically necessary. The treatment options including medical management, injection therapy, arthroscopy and arthroplasty were discussed at length. The risks and benefits of total hip arthroplasty were presented and reviewed. The risks due to aseptic loosening, infection, stiffness, dislocation/subluxation,  thromboembolic complications and other imponderables were discussed.  The patient acknowledged the explanation, agreed to proceed with the plan and consent was signed. Patient is being admitted for inpatient treatment for surgery, pain control, PT, OT, prophylactic antibiotics, VTE prophylaxis, progressive ambulation and ADL's and discharge planning.The patient is planning to be discharged home with home health services    Patient's anticipated LOS is less than 2 midnights, meeting these requirements: - Younger than 26 - Lives within 1 hour of care - Has a competent adult at home to recover with post-op recover - NO history of  - Chronic pain requiring  opiods  - Diabetes  - Coronary Artery Disease  - Heart failure  - Heart attack  - Stroke  - DVT/VTE  - Cardiac arrhythmia  - Respiratory Failure/COPD  - Renal failure  - Anemia  - Advanced Liver disease

## 2019-06-25 ENCOUNTER — Observation Stay (HOSPITAL_COMMUNITY): Payer: BC Managed Care – PPO

## 2019-06-25 ENCOUNTER — Encounter (HOSPITAL_COMMUNITY): Payer: Self-pay | Admitting: Orthopaedic Surgery

## 2019-06-25 ENCOUNTER — Ambulatory Visit (HOSPITAL_COMMUNITY): Payer: BC Managed Care – PPO | Admitting: Physician Assistant

## 2019-06-25 ENCOUNTER — Ambulatory Visit (HOSPITAL_COMMUNITY): Payer: BC Managed Care – PPO

## 2019-06-25 ENCOUNTER — Inpatient Hospital Stay (HOSPITAL_COMMUNITY)
Admission: AD | Admit: 2019-06-25 | Discharge: 2019-06-27 | DRG: 470 | Disposition: A | Discharge status: Home Health | Payer: BC Managed Care – PPO | Attending: Orthopaedic Surgery | Admitting: Orthopaedic Surgery

## 2019-06-25 ENCOUNTER — Encounter (HOSPITAL_COMMUNITY)
Admission: AD | Disposition: A | Discharge status: Home Health | Payer: Self-pay | Source: Other Acute Inpatient Hospital | Attending: Orthopaedic Surgery

## 2019-06-25 ENCOUNTER — Other Ambulatory Visit: Payer: Self-pay

## 2019-06-25 ENCOUNTER — Ambulatory Visit (HOSPITAL_COMMUNITY): Payer: BC Managed Care – PPO | Admitting: Certified Registered"

## 2019-06-25 DIAGNOSIS — Z96649 Presence of unspecified artificial hip joint: Secondary | ICD-10-CM

## 2019-06-25 DIAGNOSIS — Z96642 Presence of left artificial hip joint: Secondary | ICD-10-CM

## 2019-06-25 DIAGNOSIS — F329 Major depressive disorder, single episode, unspecified: Secondary | ICD-10-CM | POA: Diagnosis present

## 2019-06-25 DIAGNOSIS — L409 Psoriasis, unspecified: Secondary | ICD-10-CM | POA: Diagnosis present

## 2019-06-25 DIAGNOSIS — Z419 Encounter for procedure for purposes other than remedying health state, unspecified: Secondary | ICD-10-CM

## 2019-06-25 DIAGNOSIS — R110 Nausea: Secondary | ICD-10-CM | POA: Diagnosis not present

## 2019-06-25 DIAGNOSIS — M1612 Unilateral primary osteoarthritis, left hip: Secondary | ICD-10-CM | POA: Diagnosis not present

## 2019-06-25 DIAGNOSIS — R350 Frequency of micturition: Secondary | ICD-10-CM | POA: Diagnosis not present

## 2019-06-25 HISTORY — PX: TOTAL HIP ARTHROPLASTY: SHX124

## 2019-06-25 SURGERY — ARTHROPLASTY, HIP, TOTAL, ANTERIOR APPROACH
Anesthesia: Spinal | Site: Hip | Laterality: Left

## 2019-06-25 MED ORDER — POVIDONE-IODINE 10 % EX SWAB
2.0000 "application " | Freq: Once | CUTANEOUS | Status: AC
  Administered 2019-06-25: 2 via TOPICAL

## 2019-06-25 MED ORDER — CALCIUM CARBONATE 1250 (500 CA) MG PO TABS
1.0000 | ORAL_TABLET | ORAL | Status: DC
  Administered 2019-06-25: 500 mg via ORAL
  Filled 2019-06-25: qty 1

## 2019-06-25 MED ORDER — 0.9 % SODIUM CHLORIDE (POUR BTL) OPTIME
TOPICAL | Status: DC | PRN
  Administered 2019-06-25: 1000 mL

## 2019-06-25 MED ORDER — CALCIPOTRIENE-BETAMETH DIPROP 0.005-0.064 % EX OINT: 1.0000 "application " | TOPICAL_OINTMENT | Freq: Every day | CUTANEOUS | Status: DC | PRN

## 2019-06-25 MED ORDER — PHENYLEPHRINE HCL-NACL 10-0.9 MG/250ML-% IV SOLN
INTRAVENOUS | Status: DC | PRN
  Administered 2019-06-25: 50 ug/min via INTRAVENOUS
  Administered 2019-06-25: 60 ug/min via INTRAVENOUS

## 2019-06-25 MED ORDER — PROMETHAZINE HCL 25 MG/ML IJ SOLN
INTRAMUSCULAR | Status: AC
  Filled 2019-06-25: qty 1

## 2019-06-25 MED ORDER — GABAPENTIN 100 MG PO CAPS
100.0000 mg | ORAL_CAPSULE | Freq: Three times a day (TID) | ORAL | Status: DC
  Administered 2019-06-25 – 2019-06-27 (×7): 100 mg via ORAL
  Filled 2019-06-25 (×7): qty 1

## 2019-06-25 MED ORDER — CEFAZOLIN SODIUM-DEXTROSE 2-4 GM/100ML-% IV SOLN
2.0000 g | INTRAVENOUS | Status: AC
  Administered 2019-06-25: 2 g via INTRAVENOUS

## 2019-06-25 MED ORDER — KETOROLAC TROMETHAMINE 30 MG/ML IJ SOLN: 30.0000 mg | Freq: Once | INTRAMUSCULAR | Status: DC | PRN

## 2019-06-25 MED ORDER — DEXAMETHASONE SODIUM PHOSPHATE 10 MG/ML IJ SOLN
INTRAMUSCULAR | Status: DC | PRN
  Administered 2019-06-25: 10 mg via INTRAVENOUS

## 2019-06-25 MED ORDER — CEFAZOLIN SODIUM-DEXTROSE 1-4 GM/50ML-% IV SOLN
1.0000 g | Freq: Four times a day (QID) | INTRAVENOUS | Status: AC
  Administered 2019-06-25 (×2): 1 g via INTRAVENOUS
  Filled 2019-06-25 (×2): qty 50

## 2019-06-25 MED ORDER — MEPERIDINE HCL 50 MG/ML IJ SOLN: 6.2500 mg | INTRAMUSCULAR | Status: DC | PRN

## 2019-06-25 MED ORDER — DIPHENHYDRAMINE HCL 12.5 MG/5ML PO ELIX: 12.5000 mg | ORAL_SOLUTION | ORAL | Status: DC | PRN

## 2019-06-25 MED ORDER — STERILE WATER FOR IRRIGATION IR SOLN
Status: DC | PRN
  Administered 2019-06-25: 2000 mL

## 2019-06-25 MED ORDER — DEXAMETHASONE SODIUM PHOSPHATE 10 MG/ML IJ SOLN
INTRAMUSCULAR | Status: AC
  Filled 2019-06-25: qty 1

## 2019-06-25 MED ORDER — ACETAMINOPHEN 325 MG PO TABS
325.0000 mg | ORAL_TABLET | Freq: Four times a day (QID) | ORAL | Status: DC | PRN
  Administered 2019-06-26 – 2019-06-27 (×3): 650 mg via ORAL
  Filled 2019-06-25 (×3): qty 2

## 2019-06-25 MED ORDER — ONDANSETRON HCL 4 MG/2ML IJ SOLN
4.0000 mg | Freq: Four times a day (QID) | INTRAMUSCULAR | Status: DC | PRN
  Administered 2019-06-25 – 2019-06-26 (×2): 4 mg via INTRAVENOUS
  Filled 2019-06-25: qty 2

## 2019-06-25 MED ORDER — ACITRETIN 25 MG PO CAPS: 25.0000 mg | ORAL_CAPSULE | ORAL | Status: DC

## 2019-06-25 MED ORDER — SODIUM CHLORIDE 0.9 % IV SOLN: INTRAVENOUS | Status: DC

## 2019-06-25 MED ORDER — ACETAMINOPHEN 500 MG PO TABS: 1000.0000 mg | ORAL_TABLET | Freq: Once | ORAL | Status: DC

## 2019-06-25 MED ORDER — METHOCARBAMOL 500 MG IVPB - SIMPLE MED
INTRAVENOUS | Status: AC
  Filled 2019-06-25: qty 50

## 2019-06-25 MED ORDER — PROMETHAZINE HCL 25 MG/ML IJ SOLN
6.2500 mg | INTRAMUSCULAR | Status: DC | PRN
  Administered 2019-06-25: 6.25 mg via INTRAVENOUS

## 2019-06-25 MED ORDER — OXYCODONE HCL 5 MG PO TABS: 5.0000 mg | ORAL_TABLET | Freq: Once | ORAL | Status: DC | PRN

## 2019-06-25 MED ORDER — PHENOL 1.4 % MT LIQD: 1.0000 | OROMUCOSAL | Status: DC | PRN

## 2019-06-25 MED ORDER — ZOLPIDEM TARTRATE 5 MG PO TABS: 5.0000 mg | ORAL_TABLET | Freq: Every evening | ORAL | Status: DC | PRN

## 2019-06-25 MED ORDER — PANTOPRAZOLE SODIUM 40 MG PO TBEC
40.0000 mg | DELAYED_RELEASE_TABLET | Freq: Every day | ORAL | Status: DC
  Administered 2019-06-25 – 2019-06-27 (×3): 40 mg via ORAL
  Filled 2019-06-25 (×3): qty 1

## 2019-06-25 MED ORDER — POLYETHYLENE GLYCOL 3350 17 G PO PACK: 17.0000 g | PACK | Freq: Every day | ORAL | Status: DC | PRN

## 2019-06-25 MED ORDER — LACTATED RINGERS IV SOLN: INTRAVENOUS | Status: DC

## 2019-06-25 MED ORDER — OXYCODONE HCL 5 MG/5ML PO SOLN: 5.0000 mg | Freq: Once | ORAL | Status: DC | PRN

## 2019-06-25 MED ORDER — ONDANSETRON HCL 4 MG PO TABS
4.0000 mg | ORAL_TABLET | Freq: Four times a day (QID) | ORAL | Status: DC | PRN
  Administered 2019-06-25 – 2019-06-26 (×3): 4 mg via ORAL
  Filled 2019-06-25 (×3): qty 1

## 2019-06-25 MED ORDER — PROPOFOL 500 MG/50ML IV EMUL
INTRAVENOUS | Status: AC
  Filled 2019-06-25: qty 50

## 2019-06-25 MED ORDER — PHENYLEPHRINE HCL (PRESSORS) 10 MG/ML IV SOLN
INTRAVENOUS | Status: AC
  Filled 2019-06-25: qty 1

## 2019-06-25 MED ORDER — HYDROMORPHONE HCL 1 MG/ML IJ SOLN
0.2500 mg | INTRAMUSCULAR | Status: DC | PRN
  Administered 2019-06-25 (×2): 0.5 mg via INTRAVENOUS

## 2019-06-25 MED ORDER — TRANEXAMIC ACID-NACL 1000-0.7 MG/100ML-% IV SOLN
1000.0000 mg | INTRAVENOUS | Status: AC
  Administered 2019-06-25: 1000 mg via INTRAVENOUS

## 2019-06-25 MED ORDER — FENTANYL CITRATE (PF) 100 MCG/2ML IJ SOLN
INTRAMUSCULAR | Status: AC
  Filled 2019-06-25: qty 2

## 2019-06-25 MED ORDER — HYDROMORPHONE HCL 1 MG/ML IJ SOLN
INTRAMUSCULAR | Status: AC
  Filled 2019-06-25: qty 2

## 2019-06-25 MED ORDER — PROPOFOL 500 MG/50ML IV EMUL
INTRAVENOUS | Status: DC | PRN
  Administered 2019-06-25: 100 ug/kg/min via INTRAVENOUS

## 2019-06-25 MED ORDER — FENTANYL CITRATE (PF) 100 MCG/2ML IJ SOLN
INTRAMUSCULAR | Status: DC | PRN
  Administered 2019-06-25 (×2): 50 ug via INTRAVENOUS

## 2019-06-25 MED ORDER — ASPIRIN 81 MG PO CHEW
81.0000 mg | CHEWABLE_TABLET | Freq: Two times a day (BID) | ORAL | Status: DC
  Administered 2019-06-25 – 2019-06-27 (×4): 81 mg via ORAL
  Filled 2019-06-25 (×4): qty 1

## 2019-06-25 MED ORDER — VITAMIN D3 25 MCG (1000 UNIT) PO TABS
1000.0000 [IU] | ORAL_TABLET | Freq: Every day | ORAL | Status: DC
  Administered 2019-06-25 – 2019-06-27 (×3): 1000 [IU] via ORAL
  Filled 2019-06-25 (×6): qty 1

## 2019-06-25 MED ORDER — METHOCARBAMOL 500 MG IVPB - SIMPLE MED
500.0000 mg | Freq: Four times a day (QID) | INTRAVENOUS | Status: DC | PRN
  Administered 2019-06-25: 500 mg via INTRAVENOUS
  Filled 2019-06-25: qty 50

## 2019-06-25 MED ORDER — METOCLOPRAMIDE HCL 5 MG/ML IJ SOLN
5.0000 mg | Freq: Three times a day (TID) | INTRAMUSCULAR | Status: DC | PRN
  Administered 2019-06-25: 10 mg via INTRAVENOUS
  Filled 2019-06-25: qty 2

## 2019-06-25 MED ORDER — METOCLOPRAMIDE HCL 5 MG PO TABS: 5.0000 mg | ORAL_TABLET | Freq: Three times a day (TID) | ORAL | Status: DC | PRN

## 2019-06-25 MED ORDER — CALCIUM CARBONATE 1500 (600 CA) MG PO TABS: 600.0000 mg | ORAL_TABLET | ORAL | Status: DC

## 2019-06-25 MED ORDER — OXYCODONE HCL 5 MG PO TABS
5.0000 mg | ORAL_TABLET | ORAL | Status: DC | PRN
  Administered 2019-06-25 (×2): 10 mg via ORAL
  Administered 2019-06-25 – 2019-06-26 (×2): 5 mg via ORAL
  Administered 2019-06-26: 10 mg via ORAL
  Administered 2019-06-26: 5 mg via ORAL
  Administered 2019-06-26 – 2019-06-27 (×4): 10 mg via ORAL
  Filled 2019-06-25 (×2): qty 2
  Filled 2019-06-25: qty 1
  Filled 2019-06-25: qty 2
  Filled 2019-06-25: qty 1
  Filled 2019-06-25 (×4): qty 2
  Filled 2019-06-25: qty 1

## 2019-06-25 MED ORDER — EPHEDRINE SULFATE-NACL 50-0.9 MG/10ML-% IV SOSY
PREFILLED_SYRINGE | INTRAVENOUS | Status: DC | PRN
  Administered 2019-06-25 (×2): 15 mg via INTRAVENOUS
  Administered 2019-06-25: 10 mg via INTRAVENOUS

## 2019-06-25 MED ORDER — CEFAZOLIN SODIUM-DEXTROSE 2-4 GM/100ML-% IV SOLN
INTRAVENOUS | Status: AC
  Filled 2019-06-25: qty 100

## 2019-06-25 MED ORDER — MIDAZOLAM HCL 2 MG/2ML IJ SOLN
INTRAMUSCULAR | Status: AC
  Filled 2019-06-25: qty 2

## 2019-06-25 MED ORDER — OXYCODONE HCL 5 MG PO TABS
10.0000 mg | ORAL_TABLET | ORAL | Status: DC | PRN
  Administered 2019-06-26: 10 mg via ORAL
  Filled 2019-06-25: qty 2

## 2019-06-25 MED ORDER — SODIUM CHLORIDE 0.9 % IR SOLN
Status: DC | PRN
  Administered 2019-06-25: 1000 mL

## 2019-06-25 MED ORDER — ALUM & MAG HYDROXIDE-SIMETH 200-200-20 MG/5ML PO SUSP: 30.0000 mL | ORAL | Status: DC | PRN

## 2019-06-25 MED ORDER — PROPOFOL 10 MG/ML IV BOLUS
INTRAVENOUS | Status: DC | PRN
  Administered 2019-06-25: 40 mg via INTRAVENOUS

## 2019-06-25 MED ORDER — TRANEXAMIC ACID-NACL 1000-0.7 MG/100ML-% IV SOLN
INTRAVENOUS | Status: AC
  Filled 2019-06-25: qty 100

## 2019-06-25 MED ORDER — HYDROMORPHONE HCL 1 MG/ML IJ SOLN: 0.5000 mg | INTRAMUSCULAR | Status: DC | PRN

## 2019-06-25 MED ORDER — DOCUSATE SODIUM 100 MG PO CAPS
100.0000 mg | ORAL_CAPSULE | Freq: Two times a day (BID) | ORAL | Status: DC
  Administered 2019-06-25 – 2019-06-27 (×4): 100 mg via ORAL
  Filled 2019-06-25 (×4): qty 1

## 2019-06-25 MED ORDER — MEPIVACAINE HCL (PF) 2 % IJ SOLN
INTRAMUSCULAR | Status: DC | PRN
  Administered 2019-06-25: 3.5 mL via EPIDURAL

## 2019-06-25 MED ORDER — CHLORHEXIDINE GLUCONATE 4 % EX LIQD: 60.0000 mL | Freq: Once | CUTANEOUS | Status: DC

## 2019-06-25 MED ORDER — DULOXETINE HCL 20 MG PO CPEP
20.0000 mg | ORAL_CAPSULE | Freq: Every day | ORAL | Status: DC
  Administered 2019-06-26 – 2019-06-27 (×2): 20 mg via ORAL
  Filled 2019-06-25 (×2): qty 1

## 2019-06-25 MED ORDER — ONDANSETRON HCL 4 MG/2ML IJ SOLN
INTRAMUSCULAR | Status: DC | PRN
  Administered 2019-06-25: 4 mg via INTRAVENOUS

## 2019-06-25 MED ORDER — MIDAZOLAM HCL 2 MG/2ML IJ SOLN
INTRAMUSCULAR | Status: DC | PRN
  Administered 2019-06-25: 2 mg via INTRAVENOUS

## 2019-06-25 MED ORDER — MENTHOL 3 MG MT LOZG: 1.0000 | LOZENGE | OROMUCOSAL | Status: DC | PRN

## 2019-06-25 MED ORDER — METHOCARBAMOL 500 MG PO TABS
500.0000 mg | ORAL_TABLET | Freq: Four times a day (QID) | ORAL | Status: DC | PRN
  Administered 2019-06-26 – 2019-06-27 (×4): 500 mg via ORAL
  Filled 2019-06-25 (×4): qty 1

## 2019-06-25 MED ORDER — ONDANSETRON HCL 4 MG/2ML IJ SOLN
INTRAMUSCULAR | Status: AC
  Filled 2019-06-25: qty 2

## 2019-06-25 MED ORDER — PROPOFOL 10 MG/ML IV BOLUS
INTRAVENOUS | Status: AC
  Filled 2019-06-25: qty 20

## 2019-06-25 SURGICAL SUPPLY — 43 items
APL SKNCLS STERI-STRIP NONHPOA (GAUZE/BANDAGES/DRESSINGS)
BAG SPEC THK2 15X12 ZIP CLS (MISCELLANEOUS) ×1
BAG ZIPLOCK 12X15 (MISCELLANEOUS) ×2 IMPLANT
BENZOIN TINCTURE PRP APPL 2/3 (GAUZE/BANDAGES/DRESSINGS) IMPLANT
BLADE SAW SGTL 18X1.27X75 (BLADE) ×2 IMPLANT
BLADE SAW SGTL 18X1.27X75MM (BLADE) ×1
CLOSURE WOUND 1/2 X4 (GAUZE/BANDAGES/DRESSINGS)
COVER PERINEAL POST (MISCELLANEOUS) ×3 IMPLANT
COVER SURGICAL LIGHT HANDLE (MISCELLANEOUS) ×3 IMPLANT
COVER WAND RF STERILE (DRAPES) ×3 IMPLANT
DRAPE STERI IOBAN 125X83 (DRAPES) ×3 IMPLANT
DRAPE U-SHAPE 47X51 STRL (DRAPES) ×6 IMPLANT
DRSG AQUACEL AG ADV 3.5X10 (GAUZE/BANDAGES/DRESSINGS) ×3 IMPLANT
DURAPREP 26ML APPLICATOR (WOUND CARE) ×3 IMPLANT
ELECT REM PT RETURN 15FT ADLT (MISCELLANEOUS) ×3 IMPLANT
FEM STEM 12/14 TAPER SZ 4 HIP (Orthopedic Implant) ×3 IMPLANT
FEMORAL STEM 12/14 TPR SZ4 HIP (Orthopedic Implant) IMPLANT
GAUZE XEROFORM 1X8 LF (GAUZE/BANDAGES/DRESSINGS) ×3 IMPLANT
GLOVE BIO SURGEON STRL SZ7.5 (GLOVE) ×3 IMPLANT
GLOVE BIOGEL PI IND STRL 8 (GLOVE) ×2 IMPLANT
GLOVE BIOGEL PI INDICATOR 8 (GLOVE) ×4
GLOVE ECLIPSE 8.0 STRL XLNG CF (GLOVE) ×3 IMPLANT
GOWN STRL REUS W/TWL XL LVL3 (GOWN DISPOSABLE) ×6 IMPLANT
HANDPIECE INTERPULSE COAX TIP (DISPOSABLE) ×3
HEAD CERAMIC 36 PLUS5 (Hips) ×2 IMPLANT
HOLDER FOLEY CATH W/STRAP (MISCELLANEOUS) ×3 IMPLANT
KIT TURNOVER KIT A (KITS) IMPLANT
LINER NEUTRAL 52X36MM PLUS 4 (Liner) ×2 IMPLANT
PACK ANTERIOR HIP CUSTOM (KITS) ×3 IMPLANT
PENCIL SMOKE EVACUATOR (MISCELLANEOUS) ×2 IMPLANT
PIN SECTOR W/GRIP ACE CUP 52MM (Hips) ×2 IMPLANT
SET HNDPC FAN SPRY TIP SCT (DISPOSABLE) ×1 IMPLANT
STAPLER VISISTAT 35W (STAPLE) ×2 IMPLANT
STRIP CLOSURE SKIN 1/2X4 (GAUZE/BANDAGES/DRESSINGS) IMPLANT
SUT ETHIBOND NAB CT1 #1 30IN (SUTURE) ×3 IMPLANT
SUT ETHILON 2 0 PS N (SUTURE) IMPLANT
SUT MNCRL AB 4-0 PS2 18 (SUTURE) IMPLANT
SUT VIC AB 0 CT1 36 (SUTURE) ×3 IMPLANT
SUT VIC AB 1 CT1 36 (SUTURE) ×3 IMPLANT
SUT VIC AB 2-0 CT1 27 (SUTURE) ×6
SUT VIC AB 2-0 CT1 TAPERPNT 27 (SUTURE) ×2 IMPLANT
TRAY FOLEY MTR SLVR 14FR STAT (SET/KITS/TRAYS/PACK) ×2 IMPLANT
YANKAUER SUCT BULB TIP 10FT TU (MISCELLANEOUS) ×3 IMPLANT

## 2019-06-25 NOTE — Op Note (Signed)
NAME: DARLIS, WRAGG MEDICAL RECORD ME:[id number] ACCOUNT [id number] DATE OF BIRTH:[birth year] FACILITY: [facility] LOCATION: [facility] PHYSICIAN:Shaunita Seney Aretha Parrot, MD  OPERATIVE REPORT  DATE OF PROCEDURE:  06/25/2019  PREOPERATIVE DIAGNOSIS:  Primary osteoarthritis and degenerative joint disease, left hip.  POSTOPERATIVE DIAGNOSIS:  Primary osteoarthritis and degenerative joint disease, left hip.  PROCEDURE:  Left total hip arthroplasty through direct anterior approach.  IMPLANTS:  DePuy Sector Gription acetabular component size 52, size 36+4 neutral polyethylene liner, size 4 Actis femoral component with high offset, size 36+5 ceramic hip ball.  SURGEON:  Vanita Panda. Magnus Ivan, MD  ASSISTANT:  Richardean Canal, PA-C  ANESTHESIA:  Spinal.  ANTIBIOTICS:  Two grams IV Ancef.  ESTIMATED BLOOD LOSS:  300 mL.  COMPLICATIONS:  None.  INDICATIONS:  The patient is a 63 year old female with debilitating arthritis involving her left hip.  She has seen other orthopedic surgeons for this before.  Her x-rays show severe arthritis of her left hip.  Her pain is daily and at this point is  detrimentally affecting her activities of daily living, her mobility, and her quality of life to the point she does wish to proceed with a total hip arthroplasty through direct anterior approach.  She has tried and failed all forms of conservative  treatment.  We discussed this thoroughly in the office showing a hip model and explained the risk of acute blood loss anemia, nerve and vessel injury, fracture, infection, dislocation, DVT and implant failure.  We talked about our goals being decreased  pain, improve mobility and overall improve quality of life.  DESCRIPTION OF PROCEDURE:  After informed consent was obtained and appropriate left hip was marked she was brought to the operating room and sat up on a stretcher where spinal anesthesia was then obtained.  She was laid in supine position on a stretcher.  Foley catheter was placed and both feet had traction boots applied to them.  Next, she was placed supine on the Hana fracture table, the perineal post in place and both legs placed in line skeletal traction device and no traction applied.  Her left  operative hip was prepped and draped with DuraPrep and sterile drapes.  A timeout was called to identify correct patient and correct left hip.  I then made an incision just inferior and posterior to the anterior superior iliac spine and carried this  obliquely down the leg.  Dissected down tensor fascia lata muscle.  Tensor fascia was then divided longitudinally to proceed with direct anterior approach to the hip.  We identified and cauterized circumflex vessels and identified the hip capsule, opened  the hip capsule in an L-type format, finding moderate joint effusion and significant wear of the femoral head and neck area.  We made our femoral neck cut with an oscillating saw and completed this with an osteotome.  I placed a corkscrew guide in the  femoral head and removed the femoral head in its entirety and found a wide area devoid of cartilage.  I then placed a bent Hohmann over the medial acetabular rim and removed remnants of the acetabular labrum and other debris.  I then began reaming from a  size 44 reamer in stepwise increments up to a size 51 with all reamers under direct visualization.  The last reamer was placed under direct fluoroscopy, so I could obtain my depth of reaming my inclination and anteversion.  I then placed the real DePuy  Sector Gription acetabular component size 52 and a 36+4 neutral polyethylene liner for that size  acetabular component.  Attention was then turned to the femur.  With the leg externally rotated to 120 degrees, extended and adducted, we were placing  Mueller retractor medially and Hohman retractor behind the greater trochanter.  We released lateral joint capsule and used a box-cutting osteotome to enter the femoral canal  and a rongeur to lateralize.  Then began broaching using the Actis broaching  system from a size zero up to a size 4.  With the size 4 in place based on her anatomy, we tried a high offset femoral neck and a 36+1.5 hip ball.  We brought the leg back over and up and with traction and internal rotation, reducing the pelvis and I  felt like it was stable somewhat, but we needed a little bit more leg length.  We dislocated the hip and removed the trial components.  I placed the real high offset Actis femoral component size 4 and I went with a 36+5 hip ball.  We reduced this in the  acetabulum and I was absolutely pleased with leg length, offset, range of motion and stability assessed mechanically and radiographically.  We then irrigated the soft tissue with normal saline solution using pulsatile lavage.  We were able to  reapproximate the joint capsule using #1 Ethibond suture.  The tensor fascia was closed with #1 Vicryl followed by 0 Vicryl to close the deep tissue, 2-0 Vicryl was used to close the subcutaneous tissue and interrupted staples used to reapproximate the  skin.  Xeroform and Aquacel dressing were applied.  She was taken off the Hana table and taken to recovery room in stable condition.  All final counts were correct.  There were no complications noted.  Of note, Benita Stabile, PA-C, assisted during the entire  case.  His assistance was crucial for facilitating all aspects of this case.  CN/NUANCE  D:06/25/2019 T:06/25/2019 JOB:[id number]

## 2019-06-25 NOTE — Anesthesia Preprocedure Evaluation (Signed)
Anesthesia Evaluation  Patient identified by MRN, date of birth, ID band Patient awake    Reviewed: Allergy & Precautions, NPO status , Patient's Chart, lab work & pertinent test results  Airway Mallampati: II  TM Distance: >3 FB Neck ROM: Full    Dental no notable dental hx.    Pulmonary neg pulmonary ROS,    Pulmonary exam normal breath sounds clear to auscultation       Cardiovascular negative cardio ROS Normal cardiovascular exam Rhythm:Regular Rate:Normal     Neuro/Psych PSYCHIATRIC DISORDERS Anxiety Depression negative neurological ROS     GI/Hepatic negative GI ROS, Neg liver ROS,   Endo/Other  Obesity BMI 34  Renal/GU negative Renal ROS  negative genitourinary   Musculoskeletal  (+) Arthritis , L hip osteoarthritis, avascular necrosis    Abdominal   Peds negative pediatric ROS (+)  Hematology negative hematology ROS (+) Hct 44.3, plt 267   Anesthesia Other Findings Psoriasis   Reproductive/Obstetrics negative OB ROS                             Anesthesia Physical Anesthesia Plan  ASA: II  Anesthesia Plan: Spinal   Post-op Pain Management:    Induction:   PONV Risk Score and Plan: 2 and Propofol infusion and TIVA  Airway Management Planned: Natural Airway and Nasal Cannula  Additional Equipment: None  Intra-op Plan:   Post-operative Plan:   Informed Consent: I have reviewed the patients History and Physical, chart, labs and discussed the procedure including the risks, benefits and alternatives for the proposed anesthesia with the patient or authorized representative who has indicated his/her understanding and acceptance.       Plan Discussed with: CRNA  Anesthesia Plan Comments:         Anesthesia Quick Evaluation

## 2019-06-25 NOTE — Transfer of Care (Signed)
Immediate Anesthesia Transfer of Care Note  Patient: Stacey Patrick  Procedure(s) Performed: LEFT TOTAL HIP ARTHROPLASTY ANTERIOR APPROACH (Left Hip)  Patient Location: PACU  Anesthesia Type:Spinal  Level of Consciousness: awake, alert  and oriented  Airway & Oxygen Therapy: Patient Spontanous Breathing and Patient connected to face mask oxygen  Post-op Assessment: Report given to RN and Post -op Vital signs reviewed and stable  Post vital signs: Reviewed and stable  Last Vitals:  Vitals Value Taken Time  BP 109/60 06/25/19 0855  Temp    Pulse 81 06/25/19 0857  Resp 16 06/25/19 0857  SpO2 94 % 06/25/19 0857  Vitals shown include unvalidated device data.  Last Pain:  Vitals:   06/25/19 0550  TempSrc: Oral  PainSc: 5       Patients Stated Pain Goal: 5 (05/03/24 3664)  Complications: No apparent anesthesia complications

## 2019-06-25 NOTE — Brief Op Note (Signed)
06/25/2019  8:31 AM  PATIENT:  Stacey Patrick  63 y.o. female  PRE-OPERATIVE DIAGNOSIS:  left hip osteoarthritis, avascular necrosis  POST-OPERATIVE DIAGNOSIS:  left hip osteoarthritis, avascular necrosis  PROCEDURE:  Procedure(s): LEFT TOTAL HIP ARTHROPLASTY ANTERIOR APPROACH (Left)  SURGEON:  Surgeon(s) and Role:    Mcarthur Rossetti, MD - Primary  PHYSICIAN ASSISTANT: Benita Stabile, PA-C  ANESTHESIA:   spinal  EBL:  200 mL   COUNTS:  YES  DICTATION: .Other Dictation: Dictation Number [id number]  PLAN OF CARE: Admit for overnight observation  PATIENT DISPOSITION:  PACU - hemodynamically stable.   Delay start of Pharmacological VTE agent (>24hrs) due to surgical blood loss or risk of bleeding: no

## 2019-06-25 NOTE — Evaluation (Signed)
Physical Therapy Evaluation Patient Details Name: Stacey Patrick MRN: [id number] DOB: [birth year] Today's Date: 06/25/2019   History of Present Illness  Patient is 63 y.o. female s/p Lt THA anterior approach on 06/25/19 with PMH significant for depression, anxiety, and psoriasis.    Clinical Impression  Stacey Patrick is a 63 y.o. female POD 0 s/p Lt THA anterior approach. Patient reports independence with mobility at baseline. Patient is now limited by functional impairments (see PT problem list below) and requires min assist for bed mobility. Pt became hypotensive with symptoms upon sitting EOB and required assistance back to supine. She was unable to perform transfers and gait with RW. Patient instructed in exercise to facilitate circulation. Patient will benefit from continued skilled PT interventions to address impairments and progress towards PLOF. Acute PT will follow to progress mobility and stair training in preparation for safe discharge home.    Follow Up Recommendations Follow surgeon's recommendation for DC plan and follow-up therapies    Equipment Recommendations  Rolling walker with 5" wheels    Recommendations for Other Services       Precautions / Restrictions Precautions Precautions: Fall Restrictions Weight Bearing Restrictions: No      Mobility  Bed Mobility Overal bed mobility: Needs Assistance Bed Mobility: Supine to Sit;Sit to Supine     Supine to sit: HOB elevated Sit to supine: HOB elevated   General bed mobility comments: verbal cues required for use of bed rails and assist to raise trunk upright. pt reported nausea and lightheaded sensation ~2 minutes after rising, she became diaphoretic and was assisted back to supine. BP after ~ 2 minutes supine was 96/66 mmHg and pt reported some improvement in symptoms, RN notified.  Transfers        General transfer comment: NT due to symptomatic hypotension  Ambulation/Gait         Stairs        Wheelchair Mobility    Modified Rankin (Stroke Patients Only)       Balance Overall balance assessment: Needs assistance Sitting-balance support: Feet supported Sitting balance-Leahy Scale: Fair          Pertinent Vitals/Pain Pain Assessment: Faces Faces Pain Scale: Hurts a little bit Pain Location: Lt hip(pt limited more by nausea and lightheadedness) Pain Descriptors / Indicators: Sore Pain Intervention(s): Limited activity within patient's tolerance;Monitored during session;Repositioned(RN notified of nausea)    Home Living Family/patient expects to be discharged to:: Private residence Living Arrangements: Spouse/significant other Available Help at Discharge: Family Type of Home: House Home Access: Stairs to enter Entrance Stairs-Rails: None Entrance Stairs-Number of Steps: 2 Home Layout: Two level;Able to live on main level with bedroom/bathroom;Full bath on main level Home Equipment: None Additional Comments: pt reports she plans to borrow a BSC from a friend    Prior Function Level of Independence: Independent               Hand Dominance   Dominant Hand: Right    Extremity/Trunk Assessment   Upper Extremity Assessment Upper Extremity Assessment: Overall WFL for tasks assessed    Lower Extremity Assessment Lower Extremity Assessment: LLE deficits/detail LLE Deficits / Details: able to mobilize without assist, able to complete LAQ at EOB. LLE Sensation: WNL LLE Coordination: WNL    Cervical / Trunk Assessment Cervical / Trunk Assessment: Normal  Communication   Communication: No difficulties  Cognition Arousal/Alertness: Awake/alert Behavior During Therapy: WFL for tasks assessed/performed Overall Cognitive Status: Within Functional Limits for tasks assessed  General Comments      Exercises Total Joint Exercises Ankle Circles/Pumps: AROM;10 reps;Supine;Both   Assessment/Plan    PT Assessment Patient needs continued PT  services  PT Problem List Decreased strength;Decreased range of motion;Decreased balance;Decreased mobility;Decreased activity tolerance;Decreased knowledge of use of DME       PT Treatment Interventions DME instruction;Functional mobility training;Therapeutic activities;Gait training;Stair training;Therapeutic exercise;Balance training;Patient/family education    PT Goals (Current goals can be found in the Care Plan section)  Acute Rehab PT Goals Patient Stated Goal: to be able to get back to independence PT Goal Formulation: With patient Time For Goal Achievement: 07/02/19 Potential to Achieve Goals: Good    Frequency 7X/week    AM-PAC PT "6 Clicks" Mobility  Outcome Measure Help needed turning from your back to your side while in a flat bed without using bedrails?: A Little Help needed moving from lying on your back to sitting on the side of a flat bed without using bedrails?: A Little Help needed moving to and from a bed to a chair (including a wheelchair)?: A Lot Help needed standing up from a chair using your arms (e.g., wheelchair or bedside chair)?: A Lot Help needed to walk in hospital room?: A Lot Help needed climbing 3-5 steps with a railing? : A Lot 6 Click Score: 14    End of Session Equipment Utilized During Treatment: Gait belt Activity Tolerance: Patient tolerated treatment well Patient left: in bed;with call bell/phone within reach;with family/visitor present;with bed alarm set Nurse Communication: Mobility status;Other (comment)(notified of nausea and symptomatic hypotension) PT Visit Diagnosis: Muscle weakness (generalized) (M62.81);Difficulty in walking, not elsewhere classified (R26.2)    Time: 1343-1410 PT Time Calculation (min) (ACUTE ONLY): 27 min   Charges:   PT Evaluation $PT Eval Low Complexity: 1 Low          Renaldo Fiddler PT, DPT Physical Therapist with [facility]  06/25/2019 3:36 PM

## 2019-06-25 NOTE — Anesthesia Procedure Notes (Signed)
Spinal  Patient location during procedure: OR Start time: 06/25/2019 7:16 AM Staffing Performed: resident/CRNA  Resident/CRNA: [place] Lamyiah Crawshaw C, CRNA Preanesthetic Checklist Completed: patient identified, IV checked, site marked, risks and benefits discussed, surgical consent, monitors and equipment checked, pre-op evaluation and timeout performed Spinal Block Patient position: sitting Prep: DuraPrep and site prepped and draped Patient monitoring: heart rate, cardiac monitor, continuous pulse ox and blood pressure Approach: midline Location: L3-4 Injection technique: single-shot Needle Needle type: Pencan  Needle gauge: 24 G Needle length: 9 cm Assessment Sensory level: T4 Additional Notes IV functioning, monitors applied to pt. Expiration date of kit checked and confirmed to be in date. Sterile prep and drape, hand hygiene and sterile gloved used. Pt was positioned and spine was prepped in sterile fashion. Skin was anesthetized with lidocaine. Free flow of clear CSF obtained prior to injecting local anesthetic into CSF x 1 attempt. Spinal needle aspirated freely following injection. Needle was carefully withdrawn, and pt tolerated procedure well. Loss of motor and sensory on exam post injection.

## 2019-06-25 NOTE — Anesthesia Postprocedure Evaluation (Signed)
Anesthesia Post Note  Patient: Stacey Patrick  Procedure(s) Performed: LEFT TOTAL HIP ARTHROPLASTY ANTERIOR APPROACH (Left Hip)     Patient location during evaluation: PACU Anesthesia Type: Spinal Level of consciousness: oriented and awake and alert Pain management: pain level controlled Vital Signs Assessment: post-procedure vital signs reviewed and stable Respiratory status: spontaneous breathing, respiratory function stable and patient connected to nasal cannula oxygen Cardiovascular status: blood pressure returned to baseline and stable Postop Assessment: no headache, no backache, no apparent nausea or vomiting and patient able to bend at knees Anesthetic complications: no    Last Vitals:  Vitals:   06/25/19 0956 06/25/19 1000  BP: (!) 147/80 135/64  Pulse: 66 65  Resp: 16 (!) 21  Temp:    SpO2: 98% 100%    Last Pain:  Vitals:   06/25/19 0945  TempSrc:   PainSc: [place]

## 2019-06-25 NOTE — H&P (Signed)
I have seen and examined the patient.  There has been not acute changes in her health.  The left hip is marked and informed consent is obtained.  See recent H&P.

## 2019-06-26 DIAGNOSIS — L409 Psoriasis, unspecified: Secondary | ICD-10-CM | POA: Diagnosis present

## 2019-06-26 DIAGNOSIS — R350 Frequency of micturition: Secondary | ICD-10-CM | POA: Diagnosis not present

## 2019-06-26 DIAGNOSIS — F329 Major depressive disorder, single episode, unspecified: Secondary | ICD-10-CM | POA: Diagnosis present

## 2019-06-26 DIAGNOSIS — M1612 Unilateral primary osteoarthritis, left hip: Secondary | ICD-10-CM | POA: Diagnosis present

## 2019-06-26 DIAGNOSIS — R110 Nausea: Secondary | ICD-10-CM | POA: Diagnosis not present

## 2019-06-26 LAB — CBC
HCT: 35.8 % — ABNORMAL LOW (ref 36.0–46.0)
Hemoglobin: 11.3 g/dL — ABNORMAL LOW (ref 12.0–15.0)
MCH: 29.9 pg (ref 26.0–34.0)
MCHC: 31.6 g/dL (ref 30.0–36.0)
MCV: 94.7 fL (ref 80.0–100.0)
Platelets: 247 10*3/uL (ref 150–400)
RBC: 3.78 MIL/uL — ABNORMAL LOW (ref 3.87–5.11)
RDW: 12.4 % (ref 11.5–15.5)
WBC: 12.2 10*3/uL — ABNORMAL HIGH (ref 4.0–10.5)
nRBC: 0 % (ref 0.0–0.2)

## 2019-06-26 LAB — BASIC METABOLIC PANEL
Anion gap: 9 (ref 5–15)
BUN: 15 mg/dL (ref 8–23)
CO2: 26 mmol/L (ref 22–32)
Calcium: 8.9 mg/dL (ref 8.9–10.3)
Chloride: 103 mmol/L (ref 98–111)
Creatinine, Ser: 0.7 mg/dL (ref 0.44–1.00)
GFR calc Af Amer: >60 mL/min (ref >60)
GFR calc non Af Amer: >60 mL/min (ref >60)
Glucose, Bld: 152 mg/dL — ABNORMAL HIGH (ref 70–99)
Potassium: 4.6 mmol/L (ref 3.5–5.1)
Sodium: 138 mmol/L (ref 135–145)

## 2019-06-26 MED ORDER — OXYCODONE HCL 5 MG PO TABS: 5.0000 mg | ORAL_TABLET | ORAL | 0 refills | Status: DC | PRN

## 2019-06-26 MED ORDER — ONDANSETRON 4 MG PO TBDP: 4.0000 mg | ORAL_TABLET | Freq: Three times a day (TID) | ORAL | 0 refills | Status: AC | PRN

## 2019-06-26 MED ORDER — METHOCARBAMOL 500 MG PO TABS: 500.0000 mg | ORAL_TABLET | Freq: Four times a day (QID) | ORAL | 1 refills | Status: AC | PRN

## 2019-06-26 MED ORDER — ASPIRIN 81 MG PO CHEW: 81.0000 mg | CHEWABLE_TABLET | Freq: Two times a day (BID) | ORAL | 0 refills | Status: AC

## 2019-06-26 NOTE — Progress Notes (Signed)
Physical Therapy Treatment Patient Details Name: Stacey Patrick MRN: [id number] DOB: 05-Aug-1955 Today's Date: 06/26/2019    History of Present Illness Patient is 63 y.o. female s/p Lt THA anterior approach on 06/25/19 with PMH significant for depression, anxiety, and psoriasis.    PT Comments    Progressing with mobility. Less nausea this afternoon. Plan is for possible d/c home on tomorrow.    Follow Up Recommendations  Follow surgeon's recommendation for DC plan and follow-up therapies     Equipment Recommendations  Rolling walker with 5" wheels    Recommendations for Other Services       Precautions / Restrictions Precautions Precautions: Fall Restrictions Weight Bearing Restrictions: No LLE Weight Bearing: Weight bearing as tolerated    Mobility  Bed Mobility Overal bed mobility: Needs Assistance Bed Mobility: Sit to Supine       Sit to supine: Min assist;HOB elevated   General bed mobility comments: Assist for L LE. Cues for safety, technique.  Transfers Overall transfer level: Needs assistance Equipment used: Rolling walker (2 wheeled) Transfers: Sit to/from Stand Sit to Stand: Min guard         General transfer comment: Increased time. Vcs safety, technique, hand placement.  Ambulation/Gait Ambulation/Gait assistance: Min guard Gait Distance (Feet): 70 Feet Assistive device: Rolling walker (2 wheeled) Gait Pattern/deviations: Step-to pattern     General Gait Details: Slow gait speed. VCs safety, technique, sequence.   Stairs             Wheelchair Mobility    Modified Rankin (Stroke Patients Only)       Balance Overall balance assessment: Needs assistance         Standing balance support: Bilateral upper extremity supported Standing balance-Leahy Scale: Fair                              Cognition Arousal/Alertness: Awake/alert Behavior During Therapy: WFL for tasks assessed/performed Overall Cognitive Status:  Within Functional Limits for tasks assessed                                        Exercises      General Comments        Pertinent Vitals/Pain Pain Assessment: Faces Faces Pain Scale: Hurts even more Pain Location: L hip Pain Descriptors / Indicators: Discomfort;Sore Pain Intervention(s): Monitored during session;Repositioned;Ice applied    Home Living                      Prior Function            PT Goals (current goals can now be found in the care plan section) Progress towards PT goals: Progressing toward goals    Frequency    7X/week      PT Plan Current plan remains appropriate    Co-evaluation              AM-PAC PT "6 Clicks" Mobility   Outcome Measure  Help needed turning from your back to your side while in a flat bed without using bedrails?: A Little Help needed moving from lying on your back to sitting on the side of a flat bed without using bedrails?: A Little Help needed moving to and from a bed to a chair (including a wheelchair)?: A Little Help needed standing up from a chair using your arms (  e.g., wheelchair or bedside chair)?: A Little Help needed to walk in hospital room?: A Little Help needed climbing 3-5 steps with a railing? : A Little 6 Click Score: 18    End of Session Equipment Utilized During Treatment: Gait belt Activity Tolerance: Patient tolerated treatment well Patient left: in bed;with call bell/phone within reach   PT Visit Diagnosis: Pain;Other abnormalities of gait and mobility (R26.89);Difficulty in walking, not elsewhere classified (R26.2) Pain - Right/Left: Left Pain - part of body: Hip     Time: 1415-1440 PT Time Calculation (min) (ACUTE ONLY): 25 min  Charges:  $Gait Training: 23-37 mins                       Faye Ramsay, PT Acute Rehabilitation Services

## 2019-06-26 NOTE — Progress Notes (Signed)
Subjective: 1 Day Post-Op Procedure(s) (LRB): LEFT TOTAL HIP ARTHROPLASTY ANTERIOR APPROACH (Left) Patient reports pain as moderate.  Significant nausea.  Objective: Vital signs in last 24 hours: Temp:  [97.5 F (36.4 C)-99.5 F (37.5 C)] 98.9 F (37.2 C) (12/19 0505) Pulse Rate:  [58-88] 88 (12/19 0505) Resp:  [13-21] 18 (12/19 0505) BP: (107-154)/(52-90) 119/62 (12/19 0505) SpO2:  [95 %-100 %] 96 % (12/19 0505)  Intake/Output from previous day: 12/18 0701 - 12/19 0700 In: 4022.8 [P.O.:1440; I.V.:2232.8; IV Piggyback:350] Out: 2200 [Urine:2000; Blood:200] Intake/Output this shift: Total I/O In: 240 [P.O.:240] Out: 501 [Urine:501]  Recent Labs    06/26/19 0201  HGB 11.3*   Recent Labs    06/26/19 0201  WBC 12.2*  RBC 3.78*  HCT 35.8*  PLT 247   Recent Labs    06/26/19 0201  NA 138  K 4.6  CL 103  CO2 26  BUN 15  CREATININE 0.70  GLUCOSE 152*  CALCIUM 8.9   No results for input(s): LABPT, INR in the last 72 hours.  Sensation intact distally Intact pulses distally Dorsiflexion/Plantar flexion intact Incision: dressing C/D/I   Assessment/Plan: 1 Day Post-Op Procedure(s) (LRB): LEFT TOTAL HIP ARTHROPLASTY ANTERIOR APPROACH (Left) Up with therapy Plan for discharge tomorrow Discharge home with home health  Can discharge later today if she improves.    Mcarthur Rossetti 06/26/2019, 9:49 AM

## 2019-06-26 NOTE — TOC Progression Note (Signed)
Transition of Care [facility]) - Progression Note    Patient Details  Name: Stacey Patrick MRN: [id number] Date of Birth: [birth year]  Transition [facility] Care [facility]) CM/SW Contact  Joaquin Courts, RN Phone Number: 06/26/2019, 12:22 PM  Clinical Narrative:    CM spoke with patient at bedside, patient [facility] with [facility] for [place]. Patient reports she has rolling walker and 3-in-1 at home.    Expected Discharge Plan: [place] Barriers to Discharge: Continued Medical Work up  Expected Discharge Plan and Services Expected Discharge Plan: [place]   Discharge Planning Services: CM Consult Post Acute Care Choice: [place] arrangements for the past 2 months: Single Family Home                 DME Arranged: N/A DME Agency: NA       HH Arranged: PT HH Agency: [facility] [employer]     Representative spoke with at [place]: pre arranged in md office   Social Determinants of Health ([place]) Interventions    Readmission Risk Interventions No flowsheet data found.

## 2019-06-26 NOTE — Discharge Instructions (Signed)

## 2019-06-26 NOTE — Progress Notes (Signed)
Physical Therapy Treatment Patient Details Name: Stacey Patrick MRN: [id number] DOB: 07/14/1955 Today's Date: 06/26/2019    History of Present Illness Patient is 63 y.o. female s/p Lt THA anterior approach on 06/25/19 with PMH significant for depression, anxiety, and psoriasis.    PT Comments    Progressing slowly with mobility. Mobility is limited by nausea still. Will continue to progress activity as tolerated.    Follow Up Recommendations  Follow surgeon's recommendation for DC plan and follow-up therapies     Equipment Recommendations  Rolling walker with 5" wheels    Recommendations for Other Services       Precautions / Restrictions Precautions Precautions: Fall Restrictions Weight Bearing Restrictions: No LLE Weight Bearing: Weight bearing as tolerated    Mobility  Bed Mobility Overal bed mobility: Needs Assistance Bed Mobility: Supine to Sit     Supine to sit: Min assist     General bed mobility comments: Assist for L LE. Cues for safety, technique.  Transfers Overall transfer level: Needs assistance Equipment used: Rolling walker (2 wheeled) Transfers: Sit to/from Stand Sit to Stand: Min guard         General transfer comment: Increased time. Vcs safety, technique, hand placement.  Ambulation/Gait Ambulation/Gait assistance: Min guard Gait Distance (Feet): 15 Feet(x2) Assistive device: Rolling walker (2 wheeled) Gait Pattern/deviations: Step-to pattern     General Gait Details: Slow gait speed. VCs safety, technique, sequence. Distance limited by nausea. BP 101/67.   Stairs             Wheelchair Mobility    Modified Rankin (Stroke Patients Only)       Balance Overall balance assessment: Needs assistance         Standing balance support: Bilateral upper extremity supported Standing balance-Leahy Scale: Fair                              Cognition Arousal/Alertness: Awake/alert Behavior During Therapy: WFL for  tasks assessed/performed Overall Cognitive Status: Within Functional Limits for tasks assessed                                        Exercises Total Joint Exercises Ankle Circles/Pumps: AROM;Both;10 reps;Supine Quad Sets: AROM;Both;10 reps;Supine Heel Slides: AAROM;Left;10 reps;Seated Hip ABduction/ADduction: AAROM;Left;10 reps;Seated    General Comments        Pertinent Vitals/Pain Pain Assessment: Faces Faces Pain Scale: Hurts little more Pain Location: L hip Pain Descriptors / Indicators: Discomfort;Sore Pain Intervention(s): Monitored during session;Limited activity within patient's tolerance;Ice applied;Repositioned    Home Living                      Prior Function            PT Goals (current goals can now be found in the care plan section) Progress towards PT goals: Progressing toward goals    Frequency    7X/week      PT Plan Current plan remains appropriate    Co-evaluation              AM-PAC PT "6 Clicks" Mobility   Outcome Measure  Help needed turning from your back to your side while in a flat bed without using bedrails?: A Little Help needed moving from lying on your back to sitting on the side of a flat bed without using bedrails?:  A Little Help needed moving to and from a bed to a chair (including a wheelchair)?: A Little Help needed standing up from a chair using your arms (e.g., wheelchair or bedside chair)?: A Little Help needed to walk in hospital room?: A Little Help needed climbing 3-5 steps with a railing? : A Little 6 Click Score: 18    End of Session Equipment Utilized During Treatment: Gait belt Activity Tolerance: Patient tolerated treatment well Patient left: in chair;with call bell/phone within reach   PT Visit Diagnosis: Pain;Other abnormalities of gait and mobility (R26.89);Difficulty in walking, not elsewhere classified (R26.2) Pain - Right/Left: Left Pain - part of body: Hip     Time:  7897-8478 PT Time Calculation (min) (ACUTE ONLY): 36 min  Charges:  $Gait Training: 8-22 mins $Therapeutic Exercise: 8-22 mins                        Doreatha Massed, PT Acute Rehabilitation Services

## 2019-06-27 LAB — URINALYSIS, ROUTINE W REFLEX MICROSCOPIC
Bilirubin Urine: NEGATIVE
Glucose, UA: NEGATIVE mg/dL
Hgb urine dipstick: NEGATIVE
Ketones, ur: NEGATIVE mg/dL
Leukocytes,Ua: NEGATIVE
Nitrite: NEGATIVE
Protein, ur: NEGATIVE mg/dL
Specific Gravity, Urine: 1.019 (ref 1.005–1.030)
pH: 5 (ref 5.0–8.0)

## 2019-06-27 NOTE — Progress Notes (Signed)
Still have not heard back from on call PA about an order for a urine culture. Paged on call PA again. Waiting for a response.

## 2019-06-27 NOTE — Progress Notes (Signed)
Patient discharged to home w/ family. Given all belongings, instructions. Verbalized understanding of instructions. Escorted to pov via w/c. 

## 2019-06-27 NOTE — Progress Notes (Signed)
Patient has requested to have a urine culture done due to urgency and frequency in urinating. Patient stated she has had recurrent UTI's and wants a UTI ruled out prior to discharge. Called on call PA for an order. Waiting for an order from on call PA.

## 2019-06-27 NOTE — Progress Notes (Signed)
Received verbal order from Rodell Perna, MD for a urinalysis on this patient.

## 2019-06-27 NOTE — Progress Notes (Signed)
Physical Therapy Treatment Patient Details Name: Stacey Patrick MRN: [id number] DOB: April 12, 1956 Today's Date: 06/27/2019    History of Present Illness Patient is 63 y.o. female s/p Lt THA anterior approach on 06/25/19 with PMH significant for depression, anxiety, and psoriasis.    PT Comments    Progressing well with mobility. Reviewed/practiced exercises, gait training and stair training. Issued HEP for pt to perform 2x/day until HHPT begins. All education completed. Okay to d/c from PT standpoint.    Follow Up Recommendations  Follow surgeon's recommendation for DC plan and follow-up therapies     Equipment Recommendations  Rolling walker with 5" wheels    Recommendations for Other Services       Precautions / Restrictions Precautions Precautions: Fall Restrictions Weight Bearing Restrictions: No LLE Weight Bearing: Weight bearing as tolerated    Mobility  Bed Mobility Overal bed mobility: Needs Assistance Bed Mobility: Supine to Sit;Sit to Supine     Supine to sit: Supervision;HOB elevated Sit to supine: HOB elevated   General bed mobility comments: Pt used leg lifter to assist L LE on/off bed.  Transfers Overall transfer level: Needs assistance Equipment used: Rolling walker (2 wheeled) Transfers: Sit to/from Stand Sit to Stand: Supervision         General transfer comment: Increased time. Vcs safety, technique, hand placement.  Ambulation/Gait Ambulation/Gait assistance: Min guard Gait Distance (Feet): 125 Feet Assistive device: Rolling walker (2 wheeled) Gait Pattern/deviations: Step-to pattern;Step-through pattern;Decreased stride length     General Gait Details: Slow gait speed.   Stairs Stairs: Yes Stairs assistance: Min assist Stair Management: Forwards;With walker Number of Stairs: 3 General stair comments: Min assist to manage and stabilize RW. VCs safety, techique, sequence.   Wheelchair Mobility    Modified Rankin (Stroke Patients  Only)       Balance Overall balance assessment: Needs assistance         Standing balance support: Bilateral upper extremity supported Standing balance-Leahy Scale: Fair                              Cognition Arousal/Alertness: Awake/alert Behavior During Therapy: WFL for tasks assessed/performed Overall Cognitive Status: Within Functional Limits for tasks assessed                                        Exercises Total Joint Exercises Ankle Circles/Pumps: AROM;Both;10 reps;Supine Quad Sets: AROM;Both;10 reps;Supine Heel Slides: AAROM;Left;10 reps;Supine Hip ABduction/ADduction: AAROM;Left;10 reps;Supine    General Comments        Pertinent Vitals/Pain Pain Assessment: Faces Faces Pain Scale: Hurts little more Pain Location: L hip Pain Descriptors / Indicators: Discomfort;Sore Pain Intervention(s): Monitored during session;Ice applied;Repositioned    Home Living                      Prior Function            PT Goals (current goals can now be found in the care plan section) Progress towards PT goals: Progressing toward goals    Frequency    7X/week      PT Plan Current plan remains appropriate    Co-evaluation              AM-PAC PT "6 Clicks" Mobility   Outcome Measure  Help needed turning from your back to your side while in a flat  bed without using bedrails?: A Little Help needed moving from lying on your back to sitting on the side of a flat bed without using bedrails?: A Little Help needed moving to and from a bed to a chair (including a wheelchair)?: A Little Help needed standing up from a chair using your arms (e.g., wheelchair or bedside chair)?: A Little Help needed to walk in hospital room?: A Little Help needed climbing 3-5 steps with a railing? : A Little 6 Click Score: 18    End of Session Equipment Utilized During Treatment: Gait belt Activity Tolerance: Patient tolerated treatment  well Patient left: in bed;with call bell/phone within reach   PT Visit Diagnosis: Pain;Other abnormalities of gait and mobility (R26.89) Pain - Right/Left: Left Pain - part of body: Hip     Time: 4734-0370 PT Time Calculation (min) (ACUTE ONLY): 21 min  Charges:  $Gait Training: 8-22 mins                        Doreatha Massed, PT Acute Rehabilitation Services

## 2019-06-27 NOTE — Progress Notes (Signed)
   Subjective: 2 Days Post-Op Procedure(s) (LRB): LEFT TOTAL HIP ARTHROPLASTY ANTERIOR APPROACH (Left) Patient reports pain as mild and moderate.  Nauseated,  Urinary frequency.    UA sent and normal  Objective: Vital signs in last 24 hours: Temp:  [98.2 F (36.8 C)-98.9 F (37.2 C)] 98.9 F (37.2 C) (12/20 0617) Pulse Rate:  [83-100] 100 (12/20 0617) Resp:  [16-17] 16 (12/20 0617) BP: (123-128)/(67-73) 128/73 (12/20 0617) SpO2:  [96 %-97 %] 96 % (12/20 0617)  Intake/Output from previous day: 12/19 0701 - 12/20 0700 In: 2220 [P.O.:2220] Out: 1801 [Urine:1801] Intake/Output this shift: Total I/O In: 120 [P.O.:120] Out: -   Recent Labs    06/26/19 0201  HGB 11.3*   Recent Labs    06/26/19 0201  WBC 12.2*  RBC 3.78*  HCT 35.8*  PLT 247   Recent Labs    06/26/19 0201  NA 138  K 4.6  CL 103  CO2 26  BUN 15  CREATININE 0.70  GLUCOSE 152*  CALCIUM 8.9   No results for input(s): LABPT, INR in the last 72 hours.  Neurologically intact No results found.  Assessment/Plan: 2 Days Post-Op Procedure(s) (LRB): LEFT TOTAL HIP ARTHROPLASTY ANTERIOR APPROACH (Left) Up with therapy again today then discharge home.   Stacey Patrick 06/27/2019, 9:11 AM

## 2019-06-28 ENCOUNTER — Other Ambulatory Visit: Payer: Self-pay | Admitting: Orthopaedic Surgery

## 2019-06-28 MED ORDER — PROMETHAZINE HCL 12.5 MG PO TABS: 12.5000 mg | ORAL_TABLET | Freq: Four times a day (QID) | ORAL | 1 refills | Status: AC | PRN

## 2019-06-29 ENCOUNTER — Telehealth: Payer: Self-pay | Admitting: Orthopaedic Surgery

## 2019-06-29 ENCOUNTER — Encounter: Payer: Self-pay | Admitting: *Deleted

## 2019-06-29 DIAGNOSIS — M1612 Unilateral primary osteoarthritis, left hip: Secondary | ICD-10-CM

## 2019-06-29 NOTE — Telephone Encounter (Signed)
See below, with her BASA 81mg ?

## 2019-06-29 NOTE — Telephone Encounter (Signed)
LMOM of the below message  

## 2019-06-29 NOTE — Telephone Encounter (Signed)
Patient called. She would like to know if it is ok to take Ibuprofen. Her call back number is [phone]

## 2019-06-29 NOTE — Telephone Encounter (Signed)
Since it is just low dose aspirin, I am fine with her trying some ibuprofen twice daily as needed.  Up to 3 over the counter tablets.

## 2019-06-30 ENCOUNTER — Telehealth: Payer: Self-pay | Admitting: Orthopaedic Surgery

## 2019-06-30 ENCOUNTER — Other Ambulatory Visit: Payer: Self-pay | Admitting: Orthopaedic Surgery

## 2019-06-30 MED ORDER — HYDROCODONE-ACETAMINOPHEN 5-325 MG PO TABS: 1.0000 | ORAL_TABLET | Freq: Four times a day (QID) | ORAL | 0 refills | Status: AC | PRN

## 2019-06-30 NOTE — Telephone Encounter (Signed)
Patient called. She is requesting her medication be switched from Oxycodone to Hydrocodone.   Call back number: [phone]

## 2019-06-30 NOTE — Telephone Encounter (Signed)
Please advise 

## 2019-07-08 ENCOUNTER — Other Ambulatory Visit: Payer: Self-pay

## 2019-07-08 ENCOUNTER — Encounter: Payer: Self-pay | Admitting: Orthopaedic Surgery

## 2019-07-08 ENCOUNTER — Ambulatory Visit (INDEPENDENT_AMBULATORY_CARE_PROVIDER_SITE_OTHER): Payer: BC Managed Care – PPO | Admitting: Orthopaedic Surgery

## 2019-07-08 DIAGNOSIS — Z96642 Presence of left artificial hip joint: Secondary | ICD-10-CM

## 2019-07-08 NOTE — Discharge Summary (Signed)
Patient ID: Stacey Patrick MRN: [id number] DOB/AGE: March 01, 1956 63 y.o.  Admit date: 06/25/2019 Discharge date: 07/08/2019  Admission Diagnoses:  Principal Problem:   Unilateral primary osteoarthritis, left hip Active Problems:   Status post total replacement of left hip   H/O total hip arthroplasty   Discharge Diagnoses:  Same  Past Medical History:  Diagnosis Date  . Anxiety   . Depression   . Psoriasis    palmar and plantar    Surgeries: Procedure(s): LEFT TOTAL HIP ARTHROPLASTY ANTERIOR APPROACH on 06/25/2019   Consultants:   Discharged Condition: Improved  Hospital Course: Stacey Patrick is an 63 y.o. female who was admitted 06/25/2019 for operative treatment ofUnilateral primary osteoarthritis, left hip. Patient has severe unremitting pain that affects sleep, daily activities, and work/hobbies. After pre-op clearance the patient was taken to the operating room on 06/25/2019 and underwent  Procedure(s): LEFT TOTAL HIP ARTHROPLASTY ANTERIOR APPROACH.    Patient was given perioperative antibiotics:  Anti-infectives (From admission, onward)   Start     Dose/Rate Route Frequency Ordered Stop   06/25/19 1300  ceFAZolin (ANCEF) IVPB 1 g/50 mL premix     1 g 100 mL/hr over 30 Minutes Intravenous Every 6 hours 06/25/19 1037 06/25/19 2025   06/25/19 0600  ceFAZolin (ANCEF) IVPB 2g/100 mL premix     2 g 200 mL/hr over 30 Minutes Intravenous On call to O.R. 06/25/19 0537 06/25/19 0733   06/25/19 0543  ceFAZolin (ANCEF) 2-4 GM/100ML-% IVPB    Note to Pharmacy: Vevelyn Royals  : cabinet override      06/25/19 0543 06/25/19 1744       Patient was given sequential compression devices, early ambulation, and chemoprophylaxis to prevent DVT.  Patient benefited maximally from hospital stay and there were no complications.    Recent vital signs: No data found.   Recent laboratory studies: No results for input(s): WBC, HGB, HCT, PLT, NA, K, CL, CO2, BUN, CREATININE, GLUCOSE,  INR, CALCIUM in the last 72 hours.  Invalid input(s): PT, 2   Discharge Medications:   Allergies as of 06/27/2019      Reactions   Benzene Rash   Ingredient in Sunscreen       Medication List    TAKE these medications   acitretin 25 MG capsule Commonly known as: SORIATANE Take 25 mg by mouth See admin instructions. 1-2 times a week   amoxicillin-clavulanate 875-125 MG tablet Commonly known as: AUGMENTIN Take 1 tablet by mouth 2 (two) times daily.   aspirin 81 MG chewable tablet Chew 1 tablet (81 mg total) by mouth 2 (two) times daily.   calcipotriene-betamethasone ointment Commonly known as: TACLONEX Apply 1 application topically daily as needed (psoriasis).   calcium carbonate 1500 (600 Ca) MG Tabs tablet Commonly known as: OSCAL Take 600 mg of elemental calcium by mouth See admin instructions. Three times a week   celecoxib 200 MG capsule Commonly known as: CeleBREX Take 1 capsule (200 mg total) by mouth 2 (two) times daily between meals as needed. What changed: when to take this   cholecalciferol 25 MCG (1000 UT) tablet Commonly known as: VITAMIN D Take 1,000 Units by mouth daily.   clobetasol ointment 0.05 % Commonly known as: TEMOVATE Apply 1 application topically daily as needed (psoriasis).   DULoxetine 20 MG capsule Commonly known as: CYMBALTA Take 20 mg by mouth daily.   Keralyt 6 % gel Generic drug: salicylic acid Apply 1 application topically daily.   methocarbamol 500 MG tablet Commonly known  as: ROBAXIN Take 1 tablet (500 mg total) by mouth every 6 (six) hours as needed for muscle spasms.   NALTREXONE HCL PO Take 6 mg by mouth daily. Compounded   ondansetron 4 MG disintegrating tablet Commonly known as: Zofran ODT Take 1 tablet (4 mg total) by mouth every 8 (eight) hours as needed for nausea or vomiting.   traZODone 50 MG tablet Commonly known as: DESYREL Take 25 mg by mouth at bedtime.       Diagnostic Studies: DG Pelvis  Portable  Result Date: 06/25/2019 CLINICAL DATA:  Status post left hip surgery. EXAM: PORTABLE PELVIS 1-2 VIEWS COMPARISON:  Fluoroscopic images of same day. FINDINGS: The left femoral and acetabular components appear to be well situated. Expected postoperative changes are seen in the surrounding soft tissues. IMPRESSION: Status post left total hip arthroplasty. Electronically Signed   By: Marijo Conception M.D.   On: 06/25/2019 09:26   DG C-Arm 1-60 Min-No Report  Result Date: 06/25/2019 Fluoroscopy was utilized by the requesting physician.  No radiographic interpretation.   DG HIP OPERATIVE UNILAT W OR W/O PELVIS LEFT  Result Date: 06/25/2019 CLINICAL DATA:  Left hip replacement. EXAM: OPERATIVE LEFT HIP (WITH PELVIS IF PERFORMED) 2 VIEWS TECHNIQUE: Fluoroscopic spot image(s) were submitted for interpretation post-operatively. COMPARISON:  MRI 04/30/2019. FINDINGS: Total left hip replacement. Hardware intact. Anatomic alignment. No acute bony abnormality. 0 minutes 25 seconds fluoroscopy time utilized. IMPRESSION: Total left hip replacement with anatomic alignment. Electronically Signed   By: [place]   On: 06/25/2019 08:34    Disposition: Discharge disposition: 01-Home or [place]    Mcarthur Rossetti, MD Follow up in 2 week(s).   Specialty: Orthopedic Surgery Contact information: [place] [address] [phone]        Home, [facility] Follow up.   Specialty: Home Health Services Why: agency will provide home health physical therapy, agency will call you to schedule first visit. Contact information: [address] [place] [address] [phone]            Signed: Erskine Emery 07/08/2019, 10:29 AM

## 2019-07-08 NOTE — Progress Notes (Signed)
Stacey Patrick comes in today almost 2 weeks status post a left total hip arthroplasty.  She is ambulate with a walker but ready to transition to a cane.  She is off her narcotics as well.  She has been taking a baby aspirin twice a day.  On exam her left hip incision looks good.  I remove the staples in place Steri-Strips.  There is no significant seroma or swelling.  Her leg lengths appear equal.  There is no calf pain and no swelling in her feet.  We had a long and thorough discussion about her getting back to driving and increasing activities as comfort allows.  We will see her back in 4 weeks to see how she is doing overall.  She will go down to 1 baby aspirin a day for a week and then can stop the aspirin.  All question concerns were answered addressed.  When she comes back in 4 weeks no x-rays are needed.

## 2019-08-05 ENCOUNTER — Ambulatory Visit (INDEPENDENT_AMBULATORY_CARE_PROVIDER_SITE_OTHER): Payer: BC Managed Care – PPO | Admitting: Orthopaedic Surgery

## 2019-08-05 ENCOUNTER — Other Ambulatory Visit: Payer: Self-pay

## 2019-08-05 ENCOUNTER — Encounter: Payer: Self-pay | Admitting: Orthopaedic Surgery

## 2019-08-05 DIAGNOSIS — Z96642 Presence of left artificial hip joint: Secondary | ICD-10-CM

## 2019-08-05 MED ORDER — CELECOXIB 200 MG PO CAPS: 200.0000 mg | ORAL_CAPSULE | Freq: Two times a day (BID) | ORAL | 1 refills | Status: DC | PRN

## 2019-08-05 NOTE — Progress Notes (Signed)
Stacey Patrick is 6 weeks status post a left total hip arthroplasty.  She says she has been doing well until this past weekend.  She went to [facility] and did a lot of walking.  She has been having some sciatic symptoms since then but overall she feels great.  On exam she got out of the exam chair quickly and easily.  She get her shoes and socks on easily.  Her left operative hip moves smoothly.  At this point I do not need to see her back for 6 months.  I did offer a steroid taper do the sciatica but she is going to just increase her Celebrex to twice a day for a little while and I think that is reasonable.  All questions and concerns were answered addressed.  I did send in a refill of her Celebrex.  If things do not improve she will let me know.  Otherwise, I do not need to see her back for 6 months and at that visit I like a standing low AP pelvis and lateral of her left operative hip.

## 2019-08-06 ENCOUNTER — Other Ambulatory Visit: Payer: Self-pay | Admitting: Orthopaedic Surgery

## 2019-11-30 ENCOUNTER — Other Ambulatory Visit: Payer: Self-pay | Admitting: Orthopaedic Surgery

## 2019-11-30 NOTE — Telephone Encounter (Signed)
Please advise 

## 2020-02-02 ENCOUNTER — Ambulatory Visit: Payer: BC Managed Care – PPO | Admitting: Orthopaedic Surgery

## 2020-02-07 ENCOUNTER — Ambulatory Visit: Payer: BC Managed Care – PPO | Admitting: Orthopaedic Surgery

## 2020-02-09 ENCOUNTER — Ambulatory Visit: Payer: Self-pay

## 2020-02-09 ENCOUNTER — Encounter: Payer: Self-pay | Admitting: Orthopaedic Surgery

## 2020-02-09 ENCOUNTER — Ambulatory Visit (INDEPENDENT_AMBULATORY_CARE_PROVIDER_SITE_OTHER): Payer: BC Managed Care – PPO | Admitting: Orthopaedic Surgery

## 2020-02-09 DIAGNOSIS — Z96642 Presence of left artificial hip joint: Secondary | ICD-10-CM

## 2020-02-09 NOTE — Progress Notes (Signed)
Office Visit Note   Patient: Stacey Patrick           Date of Birth: 1955/08/19           MRN: [id number] Visit Date: 02/09/2020              Requested by: Deloris Ping, MD [address] [place],  [place] [address] PCP: Deloris Ping, MD   Assessment & Plan: Visit Diagnoses:  1. Status post total replacement of left hip     Plan: From the standpoint of her left total hip arthroplasty, she can follow-up as needed.  All question concerns were answered and addressed.  If there is any issues at all with that hip she is to come see [place].  If things worsen or other side in terms of the hamstring pain or sciatica she will let me know.  Follow-Up Instructions: Return if symptoms worsen or fail to improve.   Orders:  Orders Placed This Encounter  Procedures  . XR HIP UNILAT W OR W/O PELVIS 1V LEFT   No orders of the defined types were placed in this encounter.     Procedures: No procedures performed   Clinical Data: No additional findings.   Subjective: Chief Complaint  Patient presents with  . Left Hip - Follow-up  The patient comes in about 8 months status post a left total hip arthroplasty.  She says the hip is doing well and she has been having some sciatic symptoms on the right side but really no issues on the left hip replacement.  She said no other acute change in her medical status.  HPI  Review of Systems She currently denies any fever, chills, nausea, vomiting  Objective: Vital Signs: There were no vitals taken for this visit.  Physical Exam She is alert and orient x3 and in no acute distress Ortho Exam examination of her left operative hip shows that it moves smoothly and fluidly with no difficulty at all.  Her right hip also moves smoothly.  Her pain seems more in the ischium which I think may be actually hamstring related not sciatica. Specialty Comments:  No specialty comments available.  Imaging: XR HIP UNILAT W OR W/O  PELVIS 1V LEFT  Result Date: 02/09/2020 An AP pelvis and lateral left hip shows a well-seated total hip arthroplasty with no complicating features.    PMFS History: Patient Active Problem List   Diagnosis Date Noted  . Status post total replacement of left hip 06/25/2019  . H/O total hip arthroplasty 06/25/2019  . Unilateral primary osteoarthritis, left hip 04/14/2019  . Skin ulcers of foot, bilateral (HCC) 04/04/2015  . Trimalleolar fracture 04/01/2014  . Allergic rhinitis 03/26/2011  . OBESITY, UNSPECIFIED 04/26/2008  . PSORIASIS 04/26/2008  . FOOT PAIN, BILATERAL 04/26/2008  . TALIPES CAVUS 04/26/2008   Past Medical History:  Diagnosis Date  . Anxiety   . Depression   . Psoriasis    palmar and plantar    History reviewed. No pertinent family history.  Past Surgical History:  Procedure Laterality Date  . FRACTURE SURGERY Left 2015   ankle  . TOTAL HIP ARTHROPLASTY Left 06/25/2019   Procedure: LEFT TOTAL HIP ARTHROPLASTY ANTERIOR APPROACH;  Surgeon: Kathryne Hitch, MD;  Location: [facility];  Service: Orthopedics;  Laterality: Left;   Social History   Occupational History  . Not on file  Tobacco Use  . Smoking status: Never Smoker  . Smokeless tobacco: Never Used  Vaping Use  .  Vaping Use: Never used  Substance and Sexual Activity  . Alcohol use: Never  . Drug use: Never  . Sexual activity: Not on file

## 2020-02-17 ENCOUNTER — Other Ambulatory Visit: Payer: Self-pay | Admitting: Orthopaedic Surgery

## 2020-04-23 ENCOUNTER — Other Ambulatory Visit: Payer: Self-pay | Admitting: Orthopaedic Surgery

## 2020-04-24 NOTE — Telephone Encounter (Signed)
Please advise 

## 2020-06-27 ENCOUNTER — Other Ambulatory Visit: Payer: Self-pay | Admitting: Orthopaedic Surgery

## 2020-06-27 NOTE — Telephone Encounter (Signed)
Please advise 

## 2021-01-16 ENCOUNTER — Other Ambulatory Visit: Payer: Self-pay | Admitting: Orthopaedic Surgery

## 2021-01-16 NOTE — Telephone Encounter (Signed)
ok 

## 2021-05-28 ENCOUNTER — Telehealth: Payer: Self-pay | Admitting: Orthopaedic Surgery

## 2021-05-28 ENCOUNTER — Other Ambulatory Visit: Payer: Self-pay | Admitting: Orthopaedic Surgery

## 2021-05-28 MED ORDER — CELECOXIB 200 MG PO CAPS: ORAL_CAPSULE | ORAL | 3 refills | Status: DC

## 2021-05-28 NOTE — Telephone Encounter (Signed)
Pt called. She would like a refill on celebrex and she would like it sent to [facility] in [place].  [address] [place], [place], [place] [address]  Royann Shivers [phone]

## 2021-07-29 IMAGING — DX DG PORTABLE PELVIS
2 series · 2 of 2 positions shown · non-contrast
Comparison: Fluoroscopic images of same day.

CLINICAL DATA: Status post left hip surgery.

EXAM:
PORTABLE PELVIS 1-2 VIEWS

[pelvis ap]
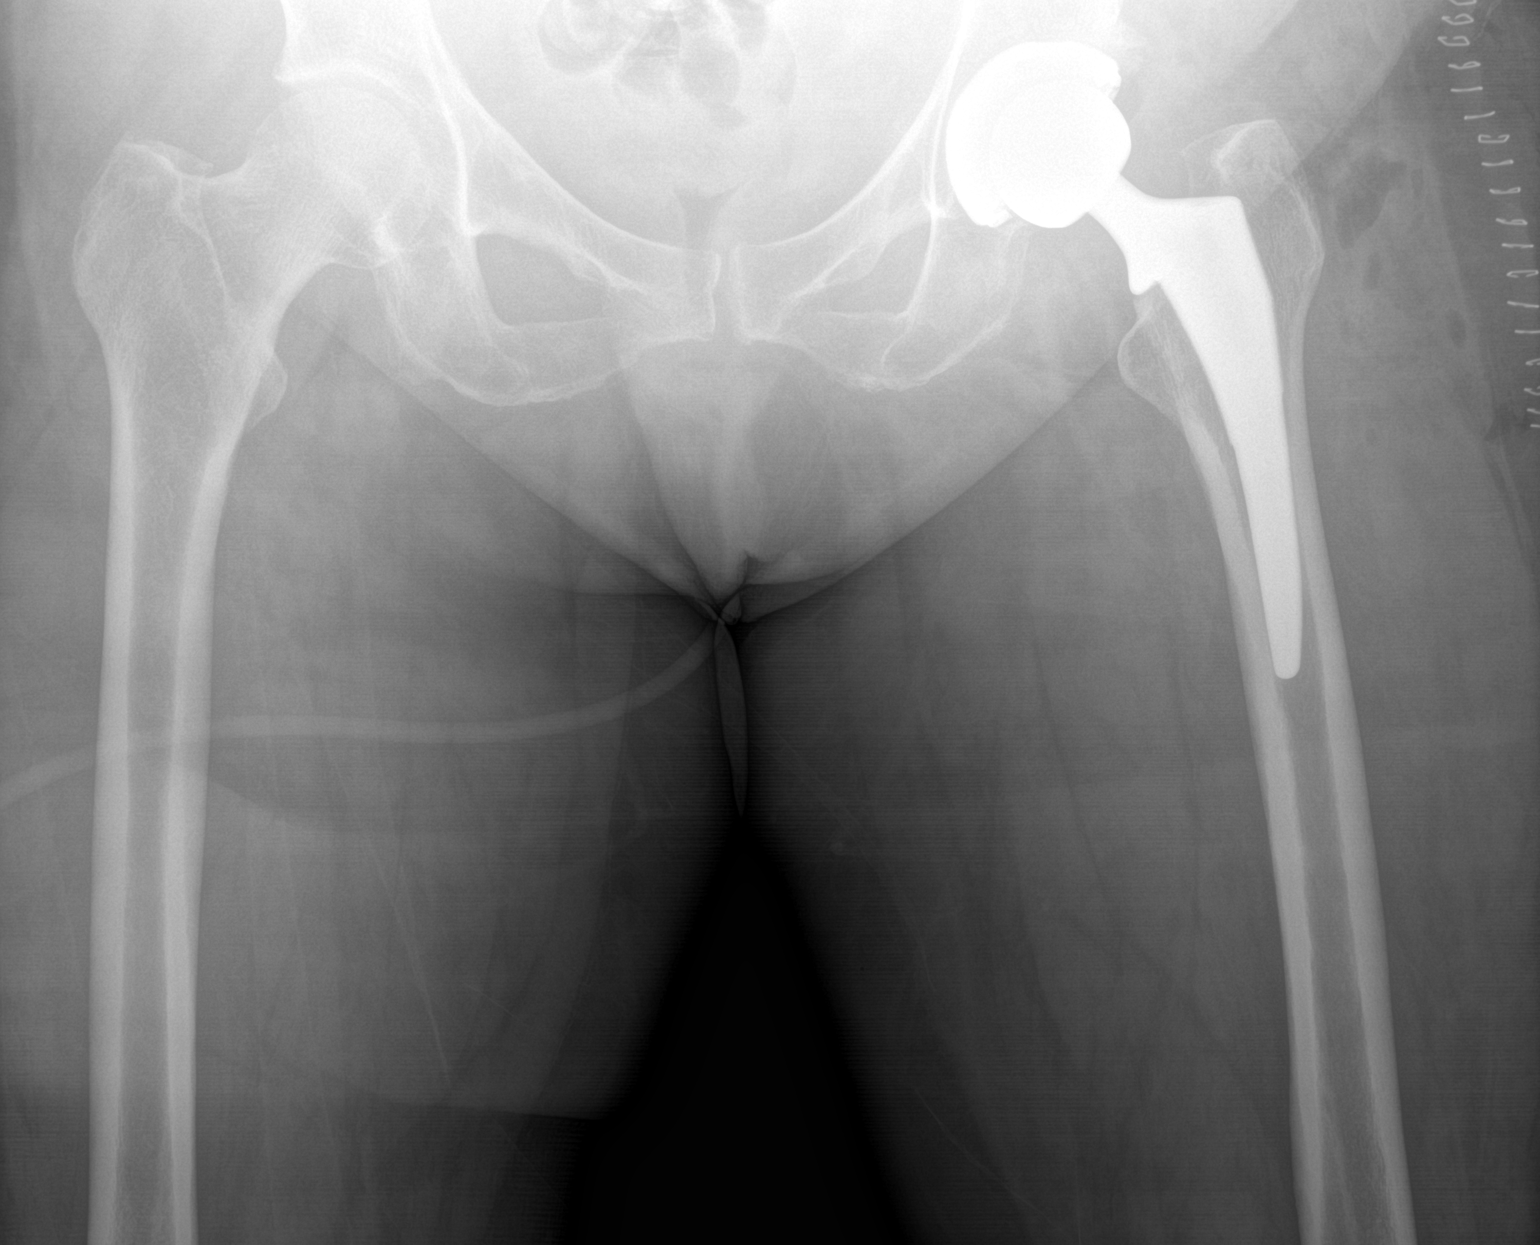

[pelvis lat]
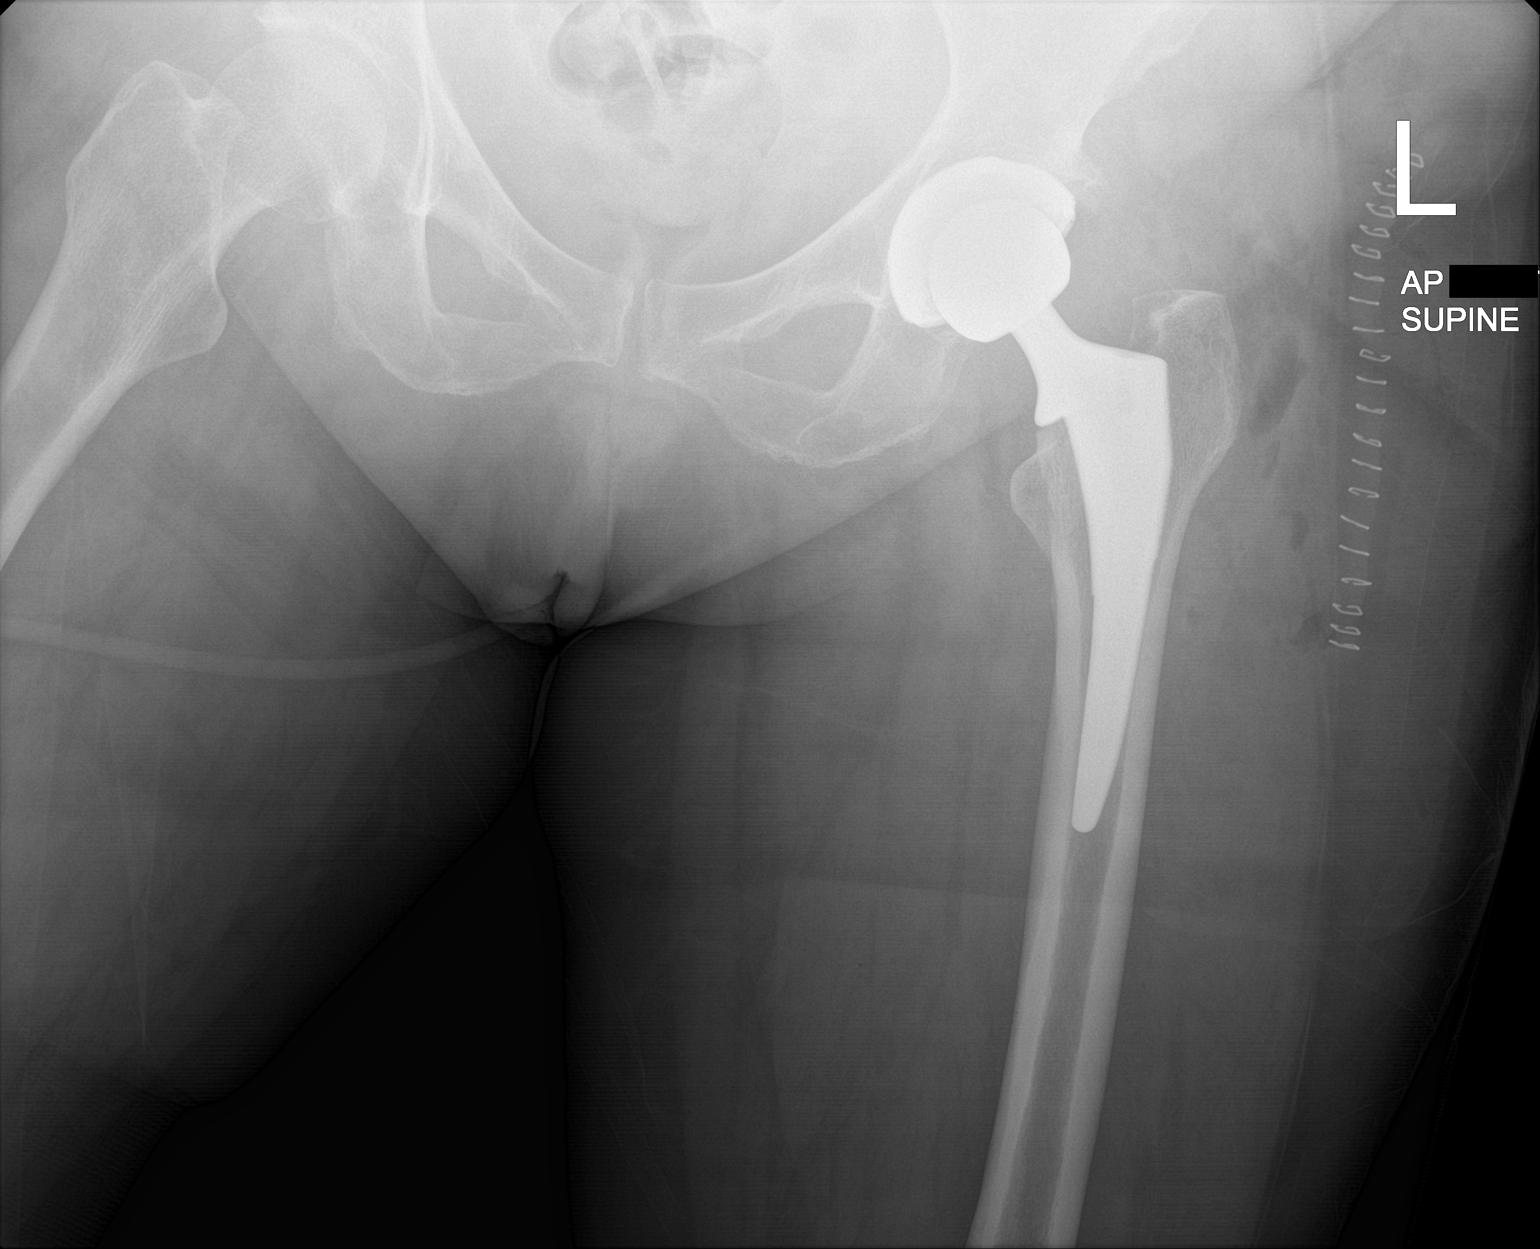

[2 of 2 positions shown; findings below may reference images not displayed]

FINDINGS: The left femoral and acetabular components appear to be well
situated. Expected postoperative changes are seen in the surrounding
soft tissues.
IMPRESSION: Status post left total hip arthroplasty.

## 2022-04-04 ENCOUNTER — Other Ambulatory Visit: Payer: Self-pay | Admitting: Orthopaedic Surgery

## 2022-05-06 ENCOUNTER — Other Ambulatory Visit: Payer: Self-pay | Admitting: Orthopaedic Surgery

## 2022-08-05 ENCOUNTER — Other Ambulatory Visit: Payer: Self-pay | Admitting: Orthopaedic Surgery

## 2022-10-11 ENCOUNTER — Other Ambulatory Visit: Payer: Self-pay | Admitting: Obstetrics and Gynecology

## 2022-10-11 DIAGNOSIS — Z13820 Encounter for screening for osteoporosis: Secondary | ICD-10-CM

## 2023-06-12 ENCOUNTER — Ambulatory Visit
Admission: RE | Admit: 2023-06-12 | Discharge: 2023-06-12 | Disposition: A | Discharge status: Home / Self Care | Payer: BC Managed Care – PPO | Source: Ambulatory Visit | Attending: Obstetrics and Gynecology | Admitting: Obstetrics and Gynecology

## 2023-06-12 DIAGNOSIS — Z13820 Encounter for screening for osteoporosis: Secondary | ICD-10-CM

## 2023-12-09 ENCOUNTER — Other Ambulatory Visit: Payer: Self-pay | Admitting: Obstetrics and Gynecology

## 2023-12-09 DIAGNOSIS — Z0000 Encounter for general adult medical examination without abnormal findings: Secondary | ICD-10-CM

## 2024-02-16 ENCOUNTER — Ambulatory Visit
Admission: RE | Admit: 2024-02-16 | Discharge: 2024-02-16 | Disposition: A | Discharge status: Home / Self Care | Source: Ambulatory Visit | Attending: Obstetrics and Gynecology | Admitting: Obstetrics and Gynecology

## 2024-02-16 ENCOUNTER — Ambulatory Visit (INDEPENDENT_AMBULATORY_CARE_PROVIDER_SITE_OTHER): Admitting: Family Medicine

## 2024-02-16 ENCOUNTER — Encounter: Payer: Self-pay | Admitting: Family Medicine

## 2024-02-16 VITALS — BP 122/84 | Ht 65.0 in | Wt 185.0 lb

## 2024-02-16 DIAGNOSIS — Q667 Congenital pes cavus, unspecified foot: Secondary | ICD-10-CM | POA: Diagnosis not present

## 2024-02-16 DIAGNOSIS — Z0000 Encounter for general adult medical examination without abnormal findings: Secondary | ICD-10-CM

## 2024-02-16 DIAGNOSIS — M79671 Pain in right foot: Secondary | ICD-10-CM

## 2024-02-16 DIAGNOSIS — M79672 Pain in left foot: Secondary | ICD-10-CM | POA: Diagnosis not present

## 2024-02-16 NOTE — Progress Notes (Signed)
 PCP: Clarance Joen HERO, MD  Subjective:   HPI: Patient is a 68 y.o. female here for custom orthotics.  Patient comes in today for a couple pair of orthotics. Ones she had back in 2018 have held up well overall. No current complaints. Did have a left hip replacement in 2020.  Past Medical History:  Diagnosis Date   Anxiety    Depression    Psoriasis    palmar and plantar    Current Outpatient Medications on File Prior to Visit  Medication Sig Dispense Refill   acitretin (SORIATANE) 25 MG capsule Take 25 mg by mouth See admin instructions. 1-2 times a week  5   amoxicillin-clavulanate (AUGMENTIN) 875-125 MG tablet Take 1 tablet by mouth 2 (two) times daily.     aspirin 81 MG chewable tablet Chew 1 tablet (81 mg total) by mouth 2 (two) times daily. 30 tablet 0   calcipotriene-betamethasone (TACLONEX) ointment Apply 1 application topically daily as needed (psoriasis).   4   calcium carbonate (OSCAL) 1500 (600 Ca) MG TABS tablet Take 600 mg of elemental calcium by mouth See admin instructions. Three times a week     celecoxib (CELEBREX) 200 MG capsule TAKE 1 CAPSULE BY MOUTH TWICE DAILY BETWEEN  MEALS  AS  NEEDED 60 capsule 0   cholecalciferol (VITAMIN D) 25 MCG (1000 UT) tablet Take 1,000 Units by mouth daily.     clobetasol ointment (TEMOVATE) 0.05 % Apply 1 application topically daily as needed (psoriasis).   0   DULoxetine (CYMBALTA) 20 MG capsule Take 20 mg by mouth daily.   0   HYDROcodone-acetaminophen (NORCO/VICODIN) 5-325 MG tablet Take 1-2 tablets by mouth every 6 (six) hours as needed for moderate pain. 30 tablet 0   KERALYT 6 % gel Apply 1 application topically daily.     methocarbamol (ROBAXIN) 500 MG tablet Take 1 tablet (500 mg total) by mouth every 6 (six) hours as needed for muscle spasms. 40 tablet 1   NALTREXONE HCL PO Take 6 mg by mouth daily. Compounded     ondansetron (ZOFRAN ODT) 4 MG disintegrating tablet Take 1 tablet (4 mg total) by mouth every 8 (eight)  hours as needed for nausea or vomiting. 20 tablet 0   promethazine (PHENERGAN) 12.5 MG tablet Take 1 tablet (12.5 mg total) by mouth every 6 (six) hours as needed for nausea or vomiting. 20 tablet 1   traZODone (DESYREL) 50 MG tablet Take 25 mg by mouth at bedtime.     No current facility-administered medications on file prior to visit.    Past Surgical History:  Procedure Laterality Date   FRACTURE SURGERY Left 2015   ankle   TOTAL HIP ARTHROPLASTY Left 06/25/2019   Procedure: LEFT TOTAL HIP ARTHROPLASTY ANTERIOR APPROACH;  Surgeon: Vernetta Lonni GRADE, MD;  Location: [facility];  Service: Orthopedics;  Laterality: Left;    Allergies  Allergen Reactions   Benzene Rash    Ingredient in Sunscreen     BP 122/84   Ht 5' 5 (1.651 m)   Wt 185 lb (83.9 kg)   BMI 30.79 kg/m       No data to display              No data to display              Objective:  Physical Exam:  Gen: NAD, comfortable in exam room  Bilateral feet/ankles: No leg length discrepancy. Cavus long arches.  No gross deformity, swelling, ecchymoses.  No  hallux rigidus or valgus. Full range of motion ankles No tenderness to palpation  NV intact distally.   Assessment & Plan:  1. Bilateral foot pain - no current complaints though initially made following long arch strain and some plantar fasciitis.  Two pair of orthotics made today.  Discussed if not feel cushioned enough would bring back to glue blue base on both pair.  Patient was fitted for a : standard, cushioned, semi-rigid orthotic. The orthotic was heated and afterward the patient stood on the orthotic blank positioned on the orthotic stand. The patient was positioned in subtalar neutral position and 10 degrees of ankle dorsiflexion in a weight bearing stance. After completion of molding, a stable base was applied to the orthotic blank. The blank was ground to a stable position for weight bearing. Size: 9 fit & run Base: none Posting:  none Additional orthotic padding: none  Total visit time 30 minutes including documentation.

## 2024-02-23 ENCOUNTER — Other Ambulatory Visit (HOSPITAL_COMMUNITY): Payer: Self-pay | Admitting: Obstetrics and Gynecology

## 2024-02-23 DIAGNOSIS — E7800 Pure hypercholesterolemia, unspecified: Secondary | ICD-10-CM

## 2024-03-16 ENCOUNTER — Other Ambulatory Visit (HOSPITAL_BASED_OUTPATIENT_CLINIC_OR_DEPARTMENT_OTHER)

## 2024-03-25 ENCOUNTER — Ambulatory Visit (HOSPITAL_BASED_OUTPATIENT_CLINIC_OR_DEPARTMENT_OTHER)
Admission: RE | Admit: 2024-03-25 | Discharge: 2024-03-25 | Disposition: A | Discharge status: Home / Self Care | Payer: Self-pay | Source: Ambulatory Visit | Attending: Obstetrics and Gynecology | Admitting: Obstetrics and Gynecology

## 2024-03-25 DIAGNOSIS — E7800 Pure hypercholesterolemia, unspecified: Secondary | ICD-10-CM | POA: Insufficient documentation

## 2024-08-16 ENCOUNTER — Ambulatory Visit: Admitting: Orthopaedic Surgery
